# Patient Record
Sex: Female | Born: 1980 | Race: White | Hispanic: No | Marital: Married | Smoking: Former smoker
Health system: Southern US, Community
[De-identification: ages and names within clinical notes are randomized; demographics above are authoritative.]

## PROBLEM LIST (undated history)

## (undated) DIAGNOSIS — F32A Depression, unspecified: Secondary | ICD-10-CM

## (undated) DIAGNOSIS — B009 Herpesviral infection, unspecified: Secondary | ICD-10-CM

## (undated) DIAGNOSIS — E039 Hypothyroidism, unspecified: Secondary | ICD-10-CM

## (undated) DIAGNOSIS — Z62819 Personal history of unspecified abuse in childhood: Secondary | ICD-10-CM

## (undated) DIAGNOSIS — F329 Major depressive disorder, single episode, unspecified: Secondary | ICD-10-CM

## (undated) HISTORY — DX: Depression, unspecified: F32.A

## (undated) HISTORY — DX: Hypothyroidism, unspecified: E03.9

## (undated) HISTORY — PX: OTHER SURGICAL HISTORY: SHX169

## (undated) HISTORY — DX: Herpesviral infection, unspecified: B00.9

## (undated) HISTORY — DX: Personal history of unspecified abuse in childhood: Z62.819

## (undated) HISTORY — DX: Major depressive disorder, single episode, unspecified: F32.9

---

## 2016-04-06 ENCOUNTER — Other Ambulatory Visit: Payer: Self-pay | Admitting: Obstetrics & Gynecology

## 2016-04-06 ENCOUNTER — Other Ambulatory Visit (HOSPITAL_COMMUNITY)
Admission: RE | Admit: 2016-04-06 | Discharge: 2016-04-06 | Disposition: A | Discharge status: Home / Self Care | Payer: 59 | Source: Ambulatory Visit | Attending: Obstetrics & Gynecology | Admitting: Obstetrics & Gynecology

## 2016-04-06 DIAGNOSIS — Z01419 Encounter for gynecological examination (general) (routine) without abnormal findings: Secondary | ICD-10-CM | POA: Diagnosis present

## 2016-04-06 DIAGNOSIS — Z1151 Encounter for screening for human papillomavirus (HPV): Secondary | ICD-10-CM | POA: Diagnosis present

## 2016-04-06 DIAGNOSIS — Z113 Encounter for screening for infections with a predominantly sexual mode of transmission: Secondary | ICD-10-CM | POA: Diagnosis present

## 2016-04-06 LAB — OB RESULTS CONSOLE RUBELLA ANTIBODY, IGM: RUBELLA: IMMUNE

## 2016-04-06 LAB — OB RESULTS CONSOLE GC/CHLAMYDIA
CHLAMYDIA, DNA PROBE: NEGATIVE
GC PROBE AMP, GENITAL: NEGATIVE

## 2016-04-06 LAB — OB RESULTS CONSOLE HIV ANTIBODY (ROUTINE TESTING): HIV: NONREACTIVE

## 2016-04-06 LAB — OB RESULTS CONSOLE ABO/RH: RH Type: POSITIVE

## 2016-04-06 LAB — OB RESULTS CONSOLE RPR: RPR: NONREACTIVE

## 2016-04-06 LAB — OB RESULTS CONSOLE ANTIBODY SCREEN: ANTIBODY SCREEN: NEGATIVE

## 2016-04-06 LAB — OB RESULTS CONSOLE HEPATITIS B SURFACE ANTIGEN: Hepatitis B Surface Ag: NEGATIVE

## 2016-04-07 LAB — CYTOLOGY - PAP

## 2016-09-04 ENCOUNTER — Ambulatory Visit (INDEPENDENT_AMBULATORY_CARE_PROVIDER_SITE_OTHER): Payer: 59 | Admitting: Family Medicine

## 2016-09-04 ENCOUNTER — Encounter: Payer: Self-pay | Admitting: Family Medicine

## 2016-09-04 VITALS — BP 102/78 | HR 92 | Temp 98.2°F | Ht 66.0 in | Wt 162.0 lb

## 2016-09-04 DIAGNOSIS — Z7689 Persons encountering health services in other specified circumstances: Secondary | ICD-10-CM

## 2016-09-04 DIAGNOSIS — E039 Hypothyroidism, unspecified: Secondary | ICD-10-CM | POA: Diagnosis not present

## 2016-09-04 DIAGNOSIS — F329 Major depressive disorder, single episode, unspecified: Secondary | ICD-10-CM | POA: Diagnosis not present

## 2016-09-04 DIAGNOSIS — F32A Depression, unspecified: Secondary | ICD-10-CM

## 2016-09-04 NOTE — Progress Notes (Signed)
Pre visit review using our clinic review tool, if applicable. No additional management support is needed unless otherwise documented below in the visit note. 

## 2016-09-04 NOTE — Progress Notes (Signed)
Patient ID: Lindsey Gay, female   DOB: Feb 27, 1981, 35 y.o.   MRN: [id number]  Patient presents to clinic today to establish care.   She is under the care of her obgyn regarding depression.  She reports that symptoms have been present for 2 to 3 months and she is currently [redacted] weeks pregnant.  She has discussed use of sertraline for symptoms with her obgyn however she is interested in counseling at this time and would prefer to initiate medication after she delivers her baby. She denies suicidal/homicidal ideation or plan.  PHQ indicates mild depression  Depression screen PHQ 2/9 09/04/2016  Decreased Interest 2  Down, Depressed, Hopeless 1  PHQ - 2 Score 3  Altered sleeping 0  Tired, decreased energy 1  Change in appetite 1  Feeling bad or failure about yourself  0  Trouble concentrating 1  Moving slowly or fidgety/restless 0  Suicidal thoughts 0  PHQ-9 Score 6  Difficult doing work/chores Not difficult at all       Followed by endocrinology for hypothyroidism and has a follow up appointment in January.  She denies any changes in hair, skin, nails, heat or cold intolerance, or GI disturbances.   Health Maintenance: Dental -- She is due; recommend twice/yearly Vision --Yearly Immunizations -- UTD; gynecology Colonoscopy -- Not needed Mammogram --Not needed PAP -- UTD; follows gynecology    ROS Constitutional: No fever, chills, significant weight change, fatigue, weakness or night sweats Eyes: No redness, discharge, pain, blurred vision, double vision, or loss of vision ENT/mouth: No nasal congestion, postnasal drainage,epistaxis, purulent discharge, earache, hearing loss, tinnitus ,sore throat , dental pain, or hoarseness   Cardiovascular: no chest pain, palpitations, racing, irregular rhythm, syncope, nausea, sweating, claudication, or edema  Respiratory: No cough, sputum production,hemoptysis,  dyspnea, paroxysmal nocturnal dyspnea, pleuritic chest pain, significant snoring,  or  apnea    Gastrointestinal: No heartburn,dysphagia, nausea and vomiting,ominal pain, change in bowels, anorexia, diarrhea, significant constipation, rectal bleeding, melena,  stool incontinence or jaundice Genitourinary: No dysuria,hematuria, pyuria, frequency, urgency,  incontinence, nocturia, dark urine or flank pain Musculoskeletal: No myalgias or muscle cramping, joint stiffness, joint swelling, joint color change, weakness, or cyanosis Dermatologic: No rash, pruritus, urticaria, or change in color or temperature of skin Neurologic: No headache, vertigo, limb weakness, tremor, gait disturbance, seizures, memory loss, numbness or tingling Psychiatric: No significant anxiety or depression, anhedonia, panic attacks, insomnia, or anorexia Endocrine: No change in hair/skin/ nails, excessive thirst, excessive hunger, excessive urination, or unexplained fatigue Hematologic/lymphatic: No bruising, lymphadenopathy,or  abnormal clotting Allergy/immunology: No itchy/ watery eyes, abnormal sneezing, rhinitis, urticaria ,or angioedema  Past Medical History:  Diagnosis Date  . Hypothyroidism      Social History   Social History  . Marital status: Married    Spouse name: N/A  . Number of children: N/A  . Years of education: N/A   Occupational History  . employee benefits     Social History Main Topics  . Smoking status: Former Games developermoker  . Smokeless tobacco: Former NeurosurgeonUser  . Alcohol use No  . Drug use: No  . Sexual activity: Yes    Birth control/ protection: None   Other Topics Concern  . Not on file   Social History Narrative  . No narrative on file    Past Surgical History:  Procedure Laterality Date  . cyst under tongue      Family History  Problem Relation Age of Onset  . Heart disease Mother   . Alcohol abuse Father   .  Cancer Father   . Drug abuse Father   . Alcohol abuse Maternal Grandmother   . Mental retardation Maternal Grandmother   . Diabetes Maternal Grandmother    . Alcohol abuse Maternal Grandfather     No Known Allergies  No current outpatient prescriptions on file prior to visit.   No current facility-administered medications on file prior to visit.     BP 102/78 (BP Location: Left Arm, Patient Position: Sitting, Cuff Size: Normal)   Pulse 92   Temp 98.2 F (36.8 C) (Oral)   Ht 5\' 6"  (1.676 m)   Wt 162 lb (73.5 kg)   SpO2 97%   BMI 26.15 kg/m     Physical Exam  Constitutional: She is oriented to person, place, and time and well-developed, well-nourished, and in no distress.  [redacted] weeks pregnant  Eyes: Pupils are equal, round, and reactive to light. No scleral icterus.  Neck: Normal range of motion.  Cardiovascular: Normal rate and regular rhythm.   Pulmonary/Chest: Effort normal and breath sounds normal. She has no wheezes. She has no rales.  Musculoskeletal: She exhibits no edema.  Lymphadenopathy:    She has no cervical adenopathy.  Neurological: She is alert and oriented to person, place, and time. Gait normal.  Skin: Skin is warm and dry. No rash noted.     Assessment/Plan:  1. Depression, unspecified depression type PHQ indicates mild depression; no suicidal/homicidal ideation present; she will see her obgyn in 2 days and she will consider use of sertraline that was recommended previously by her obgyn. Today, she is interested in counseling; provided list of providers for [facility] that she will contact for counseling.   2. Hypothyroidism, unspecified type She is followed by endocrinology and will follow up in January for lab work.  3. Encounter to establish care  We reviewed the PMH, PSH, FH, SH, Meds and Allergies. -We provided refills for any medications we will prescribe as needed. -We addressed current concerns per orders and patient instructions. -We have asked for records for pertinent exams, studies, vaccines and notes from previous providers. -We have advised patient to follow up per  instructions below.   Follow up in 6 months for physical or sooner for acute concerns.   Roddie McJulia Reagan Klemz, FNP-C

## 2016-09-04 NOTE — Patient Instructions (Addendum)
It is a pleasure meeting you today. Please follow up with your gynecologist regarding initiation of sertraline as we discussed and consider counseling also which can provide great benefit.    Also, please follow up as needed

## 2016-09-18 NOTE — L&D Delivery Note (Signed)
Delivery Note At 10:56 PM a viable female "as yet unnamed" was delivered precipitously via Vaginal, Spontaneous Delivery (Presentation: LOA). APGARS: 9, 9; weight pending.   Placenta status: Spontaneous, intact. Cord: 3 vessels with the following complications: None. Cord pH: NA  IM Pitocin given as IV infiltrated.  Anesthesia: Local for repair   Episiotomy: None Lacerations: 2nd degree perineal; right inner labial (hemostatic) Suture Repair: 3.0 vicryl CT-1 and SH Est. Blood Loss (mL): 200   Mom to postpartum.  Baby to Couplet care / Skin to Skin. Breastfeeding. Pt to discuss birth control with Dr. Charlotta Newtonzan.  Sherre ScarletWILLIAMS, Cyndra Feinberg 10/18/2016, 11:52 PM

## 2016-09-25 DIAGNOSIS — E063 Autoimmune thyroiditis: Secondary | ICD-10-CM | POA: Diagnosis not present

## 2016-09-25 DIAGNOSIS — E039 Hypothyroidism, unspecified: Secondary | ICD-10-CM | POA: Diagnosis not present

## 2016-10-04 ENCOUNTER — Ambulatory Visit: Payer: 59 | Admitting: Psychology

## 2016-10-11 ENCOUNTER — Ambulatory Visit (INDEPENDENT_AMBULATORY_CARE_PROVIDER_SITE_OTHER): Payer: 59 | Admitting: Psychology

## 2016-10-11 DIAGNOSIS — F331 Major depressive disorder, recurrent, moderate: Secondary | ICD-10-CM

## 2016-10-17 ENCOUNTER — Encounter (HOSPITAL_COMMUNITY): Payer: Self-pay | Admitting: *Deleted

## 2016-10-17 ENCOUNTER — Ambulatory Visit (INDEPENDENT_AMBULATORY_CARE_PROVIDER_SITE_OTHER): Payer: 59 | Admitting: Psychology

## 2016-10-17 ENCOUNTER — Telehealth (HOSPITAL_COMMUNITY): Payer: Self-pay | Admitting: *Deleted

## 2016-10-17 DIAGNOSIS — F331 Major depressive disorder, recurrent, moderate: Secondary | ICD-10-CM

## 2016-10-17 LAB — OB RESULTS CONSOLE GBS: STREP GROUP B AG: NEGATIVE

## 2016-10-17 NOTE — Telephone Encounter (Signed)
Preadmission screen  

## 2016-10-18 ENCOUNTER — Encounter (HOSPITAL_COMMUNITY): Payer: Self-pay | Admitting: Anesthesiology

## 2016-10-18 ENCOUNTER — Inpatient Hospital Stay (HOSPITAL_COMMUNITY)
Admission: AD | Admit: 2016-10-18 | Discharge: 2016-10-20 | DRG: 774 | Disposition: A | Discharge status: Home / Self Care | Payer: 59 | Source: Ambulatory Visit | Attending: Obstetrics & Gynecology | Admitting: Obstetrics & Gynecology

## 2016-10-18 ENCOUNTER — Encounter (HOSPITAL_COMMUNITY): Payer: Self-pay

## 2016-10-18 ENCOUNTER — Ambulatory Visit (INDEPENDENT_AMBULATORY_CARE_PROVIDER_SITE_OTHER): Payer: 59 | Admitting: Psychology

## 2016-10-18 DIAGNOSIS — Z8249 Family history of ischemic heart disease and other diseases of the circulatory system: Secondary | ICD-10-CM

## 2016-10-18 DIAGNOSIS — O99284 Endocrine, nutritional and metabolic diseases complicating childbirth: Secondary | ICD-10-CM | POA: Diagnosis present

## 2016-10-18 DIAGNOSIS — F418 Other specified anxiety disorders: Secondary | ICD-10-CM | POA: Diagnosis present

## 2016-10-18 DIAGNOSIS — Z3A38 38 weeks gestation of pregnancy: Secondary | ICD-10-CM

## 2016-10-18 DIAGNOSIS — Z833 Family history of diabetes mellitus: Secondary | ICD-10-CM

## 2016-10-18 DIAGNOSIS — Z3493 Encounter for supervision of normal pregnancy, unspecified, third trimester: Secondary | ICD-10-CM | POA: Diagnosis not present

## 2016-10-18 DIAGNOSIS — O99344 Other mental disorders complicating childbirth: Secondary | ICD-10-CM | POA: Diagnosis present

## 2016-10-18 DIAGNOSIS — Z8659 Personal history of other mental and behavioral disorders: Secondary | ICD-10-CM

## 2016-10-18 DIAGNOSIS — F331 Major depressive disorder, recurrent, moderate: Secondary | ICD-10-CM

## 2016-10-18 DIAGNOSIS — O9832 Other infections with a predominantly sexual mode of transmission complicating childbirth: Secondary | ICD-10-CM | POA: Diagnosis present

## 2016-10-18 DIAGNOSIS — A6004 Herpesviral vulvovaginitis: Secondary | ICD-10-CM | POA: Diagnosis present

## 2016-10-18 DIAGNOSIS — Z87891 Personal history of nicotine dependence: Secondary | ICD-10-CM

## 2016-10-18 DIAGNOSIS — E039 Hypothyroidism, unspecified: Secondary | ICD-10-CM | POA: Diagnosis present

## 2016-10-18 DIAGNOSIS — O4202 Full-term premature rupture of membranes, onset of labor within 24 hours of rupture: Secondary | ICD-10-CM | POA: Diagnosis present

## 2016-10-18 DIAGNOSIS — O09529 Supervision of elderly multigravida, unspecified trimester: Secondary | ICD-10-CM

## 2016-10-18 DIAGNOSIS — O09521 Supervision of elderly multigravida, first trimester: Secondary | ICD-10-CM

## 2016-10-18 LAB — CBC
HEMATOCRIT: 37.3 % (ref 36.0–46.0)
Hemoglobin: 13.2 g/dL (ref 12.0–15.0)
MCH: 32.4 pg (ref 26.0–34.0)
MCHC: 35.4 g/dL (ref 30.0–36.0)
MCV: 91.4 fL (ref 78.0–100.0)
PLATELETS: 177 10*3/uL (ref 150–400)
RBC: 4.08 MIL/uL (ref 3.87–5.11)
RDW: 13.6 % (ref 11.5–15.5)
WBC: 7.1 10*3/uL (ref 4.0–10.5)

## 2016-10-18 LAB — TYPE AND SCREEN
ABO/RH(D): O POS
Antibody Screen: NEGATIVE

## 2016-10-18 MED ORDER — ONDANSETRON HCL 4 MG PO TABS: 4.0000 mg | ORAL_TABLET | ORAL | Status: DC | PRN

## 2016-10-18 MED ORDER — ZOLPIDEM TARTRATE 5 MG PO TABS: 5.0000 mg | ORAL_TABLET | Freq: Every evening | ORAL | Status: DC | PRN

## 2016-10-18 MED ORDER — ACETAMINOPHEN 325 MG PO TABS: 650.0000 mg | ORAL_TABLET | ORAL | Status: DC | PRN

## 2016-10-18 MED ORDER — OXYCODONE-ACETAMINOPHEN 5-325 MG PO TABS: 1.0000 | ORAL_TABLET | ORAL | Status: DC | PRN

## 2016-10-18 MED ORDER — OXYTOCIN 10 UNIT/ML IJ SOLN
INTRAMUSCULAR | Status: AC
Start: 2016-10-18 — End: 2016-10-18
  Administered 2016-10-18: 10 [IU]
  Filled 2016-10-18: qty 1

## 2016-10-18 MED ORDER — ACETAMINOPHEN 325 MG PO TABS
650.0000 mg | ORAL_TABLET | ORAL | Status: DC | PRN
  Administered 2016-10-19: 650 mg via ORAL
  Filled 2016-10-18: qty 2

## 2016-10-18 MED ORDER — WITCH HAZEL-GLYCERIN EX PADS: 1.0000 "application " | MEDICATED_PAD | CUTANEOUS | Status: DC | PRN

## 2016-10-18 MED ORDER — LACTATED RINGERS IV SOLN
INTRAVENOUS | Status: DC
  Administered 2016-10-18: 22:00:00 via INTRAVENOUS

## 2016-10-18 MED ORDER — PHENYLEPHRINE 40 MCG/ML (10ML) SYRINGE FOR IV PUSH (FOR BLOOD PRESSURE SUPPORT)
80.0000 ug | PREFILLED_SYRINGE | INTRAVENOUS | Status: DC | PRN
  Filled 2016-10-18: qty 5

## 2016-10-18 MED ORDER — FENTANYL CITRATE (PF) 100 MCG/2ML IJ SOLN
50.0000 ug | INTRAMUSCULAR | Status: DC | PRN
  Filled 2016-10-18: qty 2

## 2016-10-18 MED ORDER — OXYTOCIN BOLUS FROM INFUSION: 500.0000 mL | Freq: Once | INTRAVENOUS | Status: DC

## 2016-10-18 MED ORDER — PHENYLEPHRINE 40 MCG/ML (10ML) SYRINGE FOR IV PUSH (FOR BLOOD PRESSURE SUPPORT)
PREFILLED_SYRINGE | INTRAVENOUS | Status: AC
  Filled 2016-10-18: qty 20

## 2016-10-18 MED ORDER — SOD CITRATE-CITRIC ACID 500-334 MG/5ML PO SOLN: 30.0000 mL | ORAL | Status: DC | PRN

## 2016-10-18 MED ORDER — COCONUT OIL OIL: 1.0000 "application " | TOPICAL_OIL | Status: DC | PRN

## 2016-10-18 MED ORDER — OXYTOCIN 40 UNITS IN LACTATED RINGERS INFUSION - SIMPLE MED
INTRAVENOUS | Status: AC
  Filled 2016-10-18: qty 1000

## 2016-10-18 MED ORDER — OXYCODONE-ACETAMINOPHEN 5-325 MG PO TABS
2.0000 | ORAL_TABLET | ORAL | Status: DC | PRN
Start: 2016-10-18 — End: 2016-10-20

## 2016-10-18 MED ORDER — LACTATED RINGERS IV SOLN: 500.0000 mL | INTRAVENOUS | Status: DC | PRN

## 2016-10-18 MED ORDER — FENTANYL 2.5 MCG/ML BUPIVACAINE 1/10 % EPIDURAL INFUSION (WH - ANES)
14.0000 mL/h | INTRAMUSCULAR | Status: DC | PRN
Start: 2016-10-18 — End: 2016-10-19

## 2016-10-18 MED ORDER — DIPHENHYDRAMINE HCL 50 MG/ML IJ SOLN: 12.5000 mg | INTRAMUSCULAR | Status: DC | PRN

## 2016-10-18 MED ORDER — SIMETHICONE 80 MG PO CHEW: 80.0000 mg | CHEWABLE_TABLET | ORAL | Status: DC | PRN

## 2016-10-18 MED ORDER — TETANUS-DIPHTH-ACELL PERTUSSIS 5-2.5-18.5 LF-MCG/0.5 IM SUSP
0.5000 mL | Freq: Once | INTRAMUSCULAR | Status: DC
  Filled 2016-10-18: qty 0.5

## 2016-10-18 MED ORDER — LACTATED RINGERS IV SOLN
500.0000 mL | Freq: Once | INTRAVENOUS | Status: AC
  Administered 2016-10-18: 500 mL via INTRAVENOUS

## 2016-10-18 MED ORDER — ONDANSETRON HCL 4 MG/2ML IJ SOLN: 4.0000 mg | Freq: Four times a day (QID) | INTRAMUSCULAR | Status: DC | PRN

## 2016-10-18 MED ORDER — DIPHENHYDRAMINE HCL 25 MG PO CAPS: 25.0000 mg | ORAL_CAPSULE | Freq: Four times a day (QID) | ORAL | Status: DC | PRN

## 2016-10-18 MED ORDER — OXYCODONE-ACETAMINOPHEN 5-325 MG PO TABS: 2.0000 | ORAL_TABLET | ORAL | Status: DC | PRN

## 2016-10-18 MED ORDER — EPHEDRINE 5 MG/ML INJ
10.0000 mg | INTRAVENOUS | Status: DC | PRN
  Filled 2016-10-18: qty 4

## 2016-10-18 MED ORDER — IBUPROFEN 600 MG PO TABS
600.0000 mg | ORAL_TABLET | Freq: Four times a day (QID) | ORAL | Status: DC
  Administered 2016-10-19 – 2016-10-20 (×5): 600 mg via ORAL
  Filled 2016-10-18 (×5): qty 1

## 2016-10-18 MED ORDER — FENTANYL 2.5 MCG/ML BUPIVACAINE 1/10 % EPIDURAL INFUSION (WH - ANES)
INTRAMUSCULAR | Status: AC
  Filled 2016-10-18: qty 100

## 2016-10-18 MED ORDER — FLEET ENEMA 7-19 GM/118ML RE ENEM: 1.0000 | ENEMA | RECTAL | Status: DC | PRN

## 2016-10-18 MED ORDER — DIBUCAINE 1 % RE OINT: 1.0000 "application " | TOPICAL_OINTMENT | RECTAL | Status: DC | PRN

## 2016-10-18 MED ORDER — LIDOCAINE HCL (PF) 1 % IJ SOLN
30.0000 mL | INTRAMUSCULAR | Status: DC | PRN
  Filled 2016-10-18: qty 30

## 2016-10-18 MED ORDER — LIDOCAINE HCL (PF) 1 % IJ SOLN
INTRAMUSCULAR | Status: AC
  Filled 2016-10-18: qty 30

## 2016-10-18 MED ORDER — SENNOSIDES-DOCUSATE SODIUM 8.6-50 MG PO TABS
2.0000 | ORAL_TABLET | ORAL | Status: DC
  Administered 2016-10-19: 2 via ORAL
  Filled 2016-10-18: qty 2

## 2016-10-18 MED ORDER — PRENATAL MULTIVITAMIN CH
1.0000 | ORAL_TABLET | Freq: Every day | ORAL | Status: DC
  Administered 2016-10-19: 1 via ORAL
  Filled 2016-10-18: qty 1

## 2016-10-18 MED ORDER — EPHEDRINE 5 MG/ML INJ
INTRAVENOUS | Status: AC
  Filled 2016-10-18: qty 4

## 2016-10-18 MED ORDER — BENZOCAINE-MENTHOL 20-0.5 % EX AERO
1.0000 "application " | INHALATION_SPRAY | CUTANEOUS | Status: DC | PRN
  Filled 2016-10-18: qty 56

## 2016-10-18 MED ORDER — ONDANSETRON HCL 4 MG/2ML IJ SOLN: 4.0000 mg | INTRAMUSCULAR | Status: DC | PRN

## 2016-10-18 MED ORDER — OXYTOCIN 40 UNITS IN LACTATED RINGERS INFUSION - SIMPLE MED: 2.5000 [IU]/h | INTRAVENOUS | Status: DC

## 2016-10-18 NOTE — Anesthesia Preprocedure Evaluation (Deleted)
Anesthesia Evaluation  Patient identified by MRN, date of birth, ID band Patient awake    Reviewed: Allergy & Precautions, Patient's Chart, lab work & pertinent test results  Airway Mallampati: II       Dental  (+) Teeth Intact   Pulmonary former smoker,    breath sounds clear to auscultation       Cardiovascular negative cardio ROS   Rhythm:Regular Rate:Normal     Neuro/Psych PSYCHIATRIC DISORDERS Depression negative neurological ROS     GI/Hepatic negative GI ROS, Neg liver ROS,   Endo/Other  Hypothyroidism   Renal/GU negative Renal ROS  negative genitourinary   Musculoskeletal negative musculoskeletal ROS (+)   Abdominal   Peds negative pediatric ROS (+)  Hematology negative hematology ROS (+)   Anesthesia Other Findings   Reproductive/Obstetrics negative OB ROS                             Lab Results  Component Value Date   WBC 7.1 10/18/2016   HGB 13.2 10/18/2016   HCT 37.3 10/18/2016   MCV 91.4 10/18/2016   PLT 177 10/18/2016     Anesthesia Physical Anesthesia Plan  ASA: II  Anesthesia Plan: Epidural   Post-op Pain Management:    Induction:   Airway Management Planned:   Additional Equipment:   Intra-op Plan:   Post-operative Plan:   Informed Consent: I have reviewed the patients History and Physical, chart, labs and discussed the procedure including the risks, benefits and alternatives for the proposed anesthesia with the patient or authorized representative who has indicated his/her understanding and acceptance.     Plan Discussed with:   Anesthesia Plan Comments:         Anesthesia Quick Evaluation

## 2016-10-18 NOTE — MAU Note (Signed)
Pt presents with complaint of labor 
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
## 2016-10-19 ENCOUNTER — Encounter (HOSPITAL_COMMUNITY): Payer: Self-pay

## 2016-10-19 DIAGNOSIS — O09529 Supervision of elderly multigravida, unspecified trimester: Secondary | ICD-10-CM

## 2016-10-19 DIAGNOSIS — Z8659 Personal history of other mental and behavioral disorders: Secondary | ICD-10-CM

## 2016-10-19 LAB — CBC
HEMATOCRIT: 33 % — AB (ref 36.0–46.0)
HEMOGLOBIN: 11.6 g/dL — AB (ref 12.0–15.0)
MCH: 32.2 pg (ref 26.0–34.0)
MCHC: 35.2 g/dL (ref 30.0–36.0)
MCV: 91.7 fL (ref 78.0–100.0)
Platelets: 171 10*3/uL (ref 150–400)
RBC: 3.6 MIL/uL — ABNORMAL LOW (ref 3.87–5.11)
RDW: 13.7 % (ref 11.5–15.5)
WBC: 10 10*3/uL (ref 4.0–10.5)

## 2016-10-19 LAB — ABO/RH: ABO/RH(D): O POS

## 2016-10-19 LAB — RPR: RPR: NONREACTIVE

## 2016-10-19 MED ORDER — LEVOTHYROXINE SODIUM 75 MCG PO TABS
75.0000 ug | ORAL_TABLET | Freq: Every day | ORAL | Status: DC
  Administered 2016-10-20: 75 ug via ORAL
  Filled 2016-10-19 (×2): qty 1

## 2016-10-19 MED ORDER — LEVOTHYROXINE SODIUM 50 MCG PO TABS
50.0000 ug | ORAL_TABLET | Freq: Every day | ORAL | Status: DC
  Administered 2016-10-19: 50 ug via ORAL
  Filled 2016-10-19: qty 1

## 2016-10-19 MED ORDER — SERTRALINE HCL 50 MG PO TABS
50.0000 mg | ORAL_TABLET | Freq: Every day | ORAL | Status: DC
  Filled 2016-10-19 (×2): qty 1

## 2016-10-19 MED ORDER — SERTRALINE HCL 50 MG PO TABS: 50.0000 mg | ORAL_TABLET | Freq: Every day | ORAL | Status: DC

## 2016-10-19 MED ORDER — IBUPROFEN 600 MG PO TABS
600.0000 mg | ORAL_TABLET | Freq: Four times a day (QID) | ORAL | 0 refills | Status: AC
Start: 2016-10-20 — End: ?

## 2016-10-19 NOTE — Progress Notes (Signed)
Postpartum Note Day # 1  S:  Patient resting comfortable in bed.  Pain controlled.  Tolerating general diet. No flatus, no BM.  Lochia moderate.  Ambulating without difficulty.  She denies n/v/f/c, SOB, or CP.  Pt plans on breastfeeding.  O: Temp:  [97.7 F (36.5 C)-98.5 F (36.9 C)] 97.7 F (36.5 C) (02/01 0614) Pulse Rate:  [65-117] 65 (02/01 0614) Resp:  [16-18] 16 (02/01 0614) BP: (97-121)/(51-74) 97/51 (02/01 0614) SpO2:  [98 %] 98 % (02/01 0614) Weight:  [172 lb (78 kg)] 172 lb (78 kg) (01/31 2212)   Gen: A&Ox3 CV: RRR, no MRG Resp: CTAB Abdomen: soft, NT, ND Uterus: firm, non-tender, below umbilicus Ext: No edema, no calf tenderness bilaterally  Labs:  CBC Latest Ref Rng & Units 10/19/2016 10/18/2016  WBC 4.0 - 10.5 K/uL 10.0 7.1  Hemoglobin 12.0 - 15.0 g/dL 11.6(L) 13.2  Hematocrit 36.0 - 46.0 % 33.0(L) 37.3  Platelets 150 - 400 K/uL 171 177    A/P: Pt is a 36 y.o. G2P2002 s/p NSVD, PPD#1  - Pain well controlled -GU: Voiding freely -GI: Tolerating general diet -Activity: encouraged sitting up to chair and ambulation as tolerated -Prophylaxis: early ambulation -Labs: stable as above -Anxiety/Depression: continue Zoloft 50mg  daily -Levothyroxine: continue 75mcg daily  DISPO: Continue with routine postpartum care, plan for discharge home on PPD#2.  Myna HidalgoJennifer Yochanan Eddleman, DO [phone] (pager) [phone] (office)

## 2016-10-19 NOTE — Discharge Instructions (Signed)

## 2016-10-19 NOTE — Lactation Note (Signed)
This note was copied from a baby's chart. Lactation Consultation Note  Baby 12 hours old.  P2.  Ex BF 18 months. Mother states she has recently attempted bf but baby is sleepy. Reviewed waking techniques and encouraged STS. Mother denies questions or concerns. Discussed cluster feeding, latch depth.  Mom encouraged to feed baby 8-12 times/24 hours and with feeding cues.  Mom made aware of O/P services, breastfeeding support groups, community resources, and our phone # for post-discharge questions.    Patient Name: [name]'s Date: 10/19/2016 Reason for consult: Initial assessment   Maternal Data Has patient been taught Hand Expression?: Yes Does the patient have breastfeeding experience prior to this delivery?: Yes  Feeding Feeding Type: Breast Fed  LATCH Score/Interventions                      Lactation Tools Discussed/Used     Consult Status Consult Status: Follow-up Date: 10/20/16 Follow-up type: In-patient    Dahlia ByesBerkelhammer, Wil [facility]ape [facility] 10/19/2016, 11:43 AM
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
## 2016-10-20 NOTE — Progress Notes (Signed)
Postpartum Note Day # 2  S:  Patient resting comfortable in bed.  Pain controlled.  Tolerating general diet. + flatus, no BM.  Lochia moderate.  Ambulating without difficulty.  She denies n/v/f/c, SOB, or CP.  Pt plans on breastfeeding.  O: Temp:  [97.6 F (36.4 C)-98.1 F (36.7 C)] 98.1 F (36.7 C) (02/02 0602) Pulse Rate:  [71-78] 78 (02/02 0602) Resp:  [18] 18 (02/02 0602) BP: (106-110)/(64-67) 110/67 (02/02 0602)   Gen: A&Ox3 CV: RRR, no MRG Resp: CTAB Abdomen: soft, NT, ND Uterus: firm, non-tender, below umbilicus Ext: No edema, no calf tenderness bilaterally  Labs:  CBC Latest Ref Rng & Units 10/19/2016 10/18/2016  WBC 4.0 - 10.5 K/uL 10.0 7.1  Hemoglobin 12.0 - 15.0 g/dL 11.6(L) 13.2  Hematocrit 36.0 - 46.0 % 33.0(L) 37.3  Platelets 150 - 400 K/uL 171 177    A/P: Pt is a 36 y.o. G2P2002 s/p NSVD, PPD#2  - Pain well controlled -GU: Voiding freely -GI: Tolerating general diet -Activity: encouraged sitting up to chair and ambulation as tolerated -Prophylaxis: early ambulation -Labs: stable as above -Anxiety/Depression: continue Zoloft 50mg  daily -Levothyroxine: continue 75mcg daily  DISPO: Meeting postpartum milestones appropriately, plan for discharge home today.  Myna HidalgoJennifer Braylen Denunzio, DO [phone] (pager) [phone] (office)

## 2016-10-25 NOTE — Discharge Summary (Signed)
OB Discharge Summary     Patient Name: Lindsey Gay DOB: 12-Dec-1980 MRN: [id number]  Date of admission: 10/18/2016 Delivering MD: Sherre Scarlet   Date of discharge: 10/20/2016  Admitting diagnosis: 38WKS CTX Intrauterine pregnancy: [redacted]w[redacted]d     Secondary diagnosis:  Principal Problem:   Vaginal delivery Active Problems:   Second-degree perineal laceration, with delivery   AMA (advanced maternal age) multigravida 35+   Herpes simplex vulvovaginitis   History of depression  Additional problems: none     Discharge diagnosis: Term Pregnancy Delivered                                                                                                Post partum procedures:none  Augmentation: none  Complications: None  Hospital course:  Onset of Labor With Vaginal Delivery     36 y.o. yo G2P1002 at [redacted]w[redacted]d was admitted in Active Labor on 10/18/2016. Patient had an uncomplicated labor course as follows:  Membrane Rupture Time/Date: 10:45 PM ,10/18/2016   Intrapartum Procedures: Episiotomy: None [1]                                         Lacerations:  2nd degree [3]  Patient had a delivery of a Viable infant. 10/18/2016  Information for the patient's newborn:  Peyton, Rossner [[id number]]  Delivery Method: Vag-Spont    Pateint had an uncomplicated postpartum course.  She is ambulating, tolerating a regular diet, passing flatus, and urinating well. Patient is discharged home in stable condition on 10/20/16.   Physical exam  Vitals:   10/19/16 0226 10/19/16 0614 10/19/16 1349 10/20/16 0602  BP: 112/71 (!) 97/51 106/64 110/67  Pulse: 87 65 71 78  Resp: 16 16 18 18   Temp: 97.8 F (36.6 C) 97.7 F (36.5 C) 97.6 F (36.4 C) 98.1 F (36.7 C)  TempSrc: Oral Oral Oral Oral  SpO2: 98% 98%    Weight:      Height:       General: alert, cooperative and no distress Lochia: appropriate Uterine Fundus: firm Incision: N/A DVT Evaluation: No evidence of DVT seen on physical  exam. Labs: Lab Results  Component Value Date   WBC 10.0 10/19/2016   HGB 11.6 (L) 10/19/2016   HCT 33.0 (L) 10/19/2016   MCV 91.7 10/19/2016   PLT 171 10/19/2016   No flowsheet data found.  Discharge instruction: per After Visit Summary and "Baby and Me Booklet".  After visit meds:  Allergies as of 10/20/2016   No Known Allergies     Medication List    TAKE these medications   hydrocortisone cream 1 % Apply 1 application topically 2 (two) times daily.   ibuprofen 600 MG tablet Commonly known as:  ADVIL,MOTRIN Take 1 tablet (600 mg total) by mouth every 6 (six) hours.   levothyroxine 50 MCG tablet Commonly known as:  SYNTHROID, LEVOTHROID Take 50 mcg by mouth daily before breakfast.   prenatal multivitamin Tabs tablet Take 1 tablet by mouth daily at 12 noon.  sertraline 50 MG tablet Commonly known as:  ZOLOFT Take 1 tablet (50 mg total) by mouth daily.   VITAMIN D PO Take 1 tablet by mouth daily.       Diet: routine diet  Activity: Advance as tolerated. Pelvic rest for 6 weeks.   Outpatient follow up:6 weeks Follow up Appt:No future appointments. Follow up Visit:No Follow-up on file.  Postpartum contraception: Undecided  Newborn Data: Live born female  Birth Weight: 6 lb 8.8 oz (2970 g) APGAR: 9, 9  Baby Feeding: Breast Disposition:home with mother   10/25/2016 Myna HidalgoZAN, Deshia Vanderhoof, M, DO

## 2016-10-26 ENCOUNTER — Inpatient Hospital Stay (HOSPITAL_COMMUNITY): Admission: RE | Admit: 2016-10-26 | Payer: 59 | Source: Ambulatory Visit

## 2016-11-27 DIAGNOSIS — E063 Autoimmune thyroiditis: Secondary | ICD-10-CM | POA: Diagnosis not present

## 2016-11-27 DIAGNOSIS — E039 Hypothyroidism, unspecified: Secondary | ICD-10-CM | POA: Diagnosis not present

## 2017-05-08 DIAGNOSIS — L03011 Cellulitis of right finger: Secondary | ICD-10-CM | POA: Diagnosis not present

## 2017-05-09 ENCOUNTER — Encounter (HOSPITAL_COMMUNITY): Payer: Self-pay

## 2017-05-09 ENCOUNTER — Emergency Department (HOSPITAL_COMMUNITY)
Admission: EM | Admit: 2017-05-09 | Discharge: 2017-05-09 | Disposition: A | Discharge status: Home / Self Care | Payer: 59 | Attending: Emergency Medicine | Admitting: Emergency Medicine

## 2017-05-09 DIAGNOSIS — Y658 Other specified misadventures during surgical and medical care: Secondary | ICD-10-CM | POA: Insufficient documentation

## 2017-05-09 DIAGNOSIS — T887XXA Unspecified adverse effect of drug or medicament, initial encounter: Secondary | ICD-10-CM | POA: Diagnosis not present

## 2017-05-09 DIAGNOSIS — Z79899 Other long term (current) drug therapy: Secondary | ICD-10-CM | POA: Diagnosis not present

## 2017-05-09 DIAGNOSIS — T3695XA Adverse effect of unspecified systemic antibiotic, initial encounter: Secondary | ICD-10-CM | POA: Diagnosis not present

## 2017-05-09 DIAGNOSIS — L03011 Cellulitis of right finger: Secondary | ICD-10-CM | POA: Diagnosis not present

## 2017-05-09 DIAGNOSIS — T50905A Adverse effect of unspecified drugs, medicaments and biological substances, initial encounter: Secondary | ICD-10-CM

## 2017-05-09 DIAGNOSIS — E039 Hypothyroidism, unspecified: Secondary | ICD-10-CM | POA: Diagnosis not present

## 2017-05-09 MED ORDER — SULFAMETHOXAZOLE-TRIMETHOPRIM 800-160 MG PO TABS: 1.0000 | ORAL_TABLET | Freq: Two times a day (BID) | ORAL | 0 refills | Status: AC
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
## 2017-05-09 NOTE — ED Triage Notes (Signed)
Pt reports taking keflex for finger infection and having reactions about 1 hour after each dose. Pt reports she starts to feel flushed, headache, nausea, rash that comes and goes, chills. NAD VSS. Pt has taken 3 doses at this time.

## 2017-05-22 DIAGNOSIS — T7840XA Allergy, unspecified, initial encounter: Secondary | ICD-10-CM | POA: Diagnosis not present

## 2017-05-22 DIAGNOSIS — R21 Rash and other nonspecific skin eruption: Secondary | ICD-10-CM | POA: Diagnosis not present

## 2017-06-11 DIAGNOSIS — E038 Other specified hypothyroidism: Secondary | ICD-10-CM | POA: Diagnosis not present

## 2017-06-13 DIAGNOSIS — E039 Hypothyroidism, unspecified: Secondary | ICD-10-CM | POA: Diagnosis not present

## 2017-06-13 DIAGNOSIS — E063 Autoimmune thyroiditis: Secondary | ICD-10-CM | POA: Diagnosis not present

## 2017-07-10 DIAGNOSIS — Z23 Encounter for immunization: Secondary | ICD-10-CM | POA: Diagnosis not present

## 2018-06-14 DIAGNOSIS — E039 Hypothyroidism, unspecified: Secondary | ICD-10-CM | POA: Diagnosis not present

## 2018-06-18 DIAGNOSIS — E039 Hypothyroidism, unspecified: Secondary | ICD-10-CM | POA: Diagnosis not present

## 2018-06-18 DIAGNOSIS — E063 Autoimmune thyroiditis: Secondary | ICD-10-CM | POA: Diagnosis not present

## 2018-07-09 DIAGNOSIS — Z13220 Encounter for screening for lipoid disorders: Secondary | ICD-10-CM | POA: Diagnosis not present

## 2018-07-09 DIAGNOSIS — Z0001 Encounter for general adult medical examination with abnormal findings: Secondary | ICD-10-CM | POA: Diagnosis not present

## 2018-07-29 ENCOUNTER — Other Ambulatory Visit: Payer: Self-pay | Admitting: Internal Medicine

## 2018-07-29 ENCOUNTER — Ambulatory Visit
Admission: RE | Admit: 2018-07-29 | Discharge: 2018-07-29 | Disposition: A | Discharge status: Home / Self Care | Payer: 59 | Source: Ambulatory Visit | Attending: Internal Medicine | Admitting: Internal Medicine

## 2018-07-29 DIAGNOSIS — M25572 Pain in left ankle and joints of left foot: Secondary | ICD-10-CM | POA: Diagnosis not present

## 2018-07-29 DIAGNOSIS — M25562 Pain in left knee: Secondary | ICD-10-CM

## 2018-07-29 DIAGNOSIS — M7989 Other specified soft tissue disorders: Secondary | ICD-10-CM | POA: Diagnosis not present

## 2018-07-30 DIAGNOSIS — E039 Hypothyroidism, unspecified: Secondary | ICD-10-CM | POA: Diagnosis not present

## 2018-08-08 DIAGNOSIS — J111 Influenza due to unidentified influenza virus with other respiratory manifestations: Secondary | ICD-10-CM | POA: Diagnosis not present

## 2018-08-08 DIAGNOSIS — Z20828 Contact with and (suspected) exposure to other viral communicable diseases: Secondary | ICD-10-CM | POA: Diagnosis not present

## 2019-02-04 ENCOUNTER — Encounter (HOSPITAL_COMMUNITY): Payer: Self-pay

## 2019-02-04 ENCOUNTER — Other Ambulatory Visit: Payer: Self-pay

## 2019-02-04 ENCOUNTER — Emergency Department (HOSPITAL_COMMUNITY): Payer: 59

## 2019-02-04 ENCOUNTER — Emergency Department (HOSPITAL_COMMUNITY)
Admission: EM | Admit: 2019-02-04 | Discharge: 2019-02-04 | Disposition: A | Discharge status: Home / Self Care | Payer: 59 | Attending: Emergency Medicine | Admitting: Emergency Medicine

## 2019-02-04 DIAGNOSIS — R002 Palpitations: Secondary | ICD-10-CM

## 2019-02-04 DIAGNOSIS — E039 Hypothyroidism, unspecified: Secondary | ICD-10-CM | POA: Diagnosis not present

## 2019-02-04 DIAGNOSIS — Z87891 Personal history of nicotine dependence: Secondary | ICD-10-CM | POA: Diagnosis not present

## 2019-02-04 DIAGNOSIS — Z79899 Other long term (current) drug therapy: Secondary | ICD-10-CM | POA: Diagnosis not present

## 2019-02-04 DIAGNOSIS — R079 Chest pain, unspecified: Secondary | ICD-10-CM | POA: Insufficient documentation

## 2019-02-04 LAB — CBC WITH DIFFERENTIAL/PLATELET
Abs Immature Granulocytes: 0.01 10*3/uL (ref 0.00–0.07)
Basophils Absolute: 0 10*3/uL (ref 0.0–0.1)
Basophils Relative: 1 %
Eosinophils Absolute: 0 10*3/uL (ref 0.0–0.5)
Eosinophils Relative: 1 %
HCT: 45.2 % (ref 36.0–46.0)
Hemoglobin: 15.1 g/dL — ABNORMAL HIGH (ref 12.0–15.0)
Immature Granulocytes: 0 %
Lymphocytes Relative: 29 %
Lymphs Abs: 1.1 10*3/uL (ref 0.7–4.0)
MCH: 30.9 pg (ref 26.0–34.0)
MCHC: 33.4 g/dL (ref 30.0–36.0)
MCV: 92.6 fL (ref 80.0–100.0)
Monocytes Absolute: 0.5 10*3/uL (ref 0.1–1.0)
Monocytes Relative: 12 %
Neutro Abs: 2.2 10*3/uL (ref 1.7–7.7)
Neutrophils Relative %: 57 %
Platelets: 196 10*3/uL (ref 150–400)
RBC: 4.88 MIL/uL (ref 3.87–5.11)
RDW: 12.4 % (ref 11.5–15.5)
WBC: 3.9 10*3/uL — ABNORMAL LOW (ref 4.0–10.5)
nRBC: 0 % (ref 0.0–0.2)

## 2019-02-04 LAB — BASIC METABOLIC PANEL
Anion gap: 11 (ref 5–15)
BUN: 6 mg/dL (ref 6–20)
CO2: 22 mmol/L (ref 22–32)
Calcium: 9.7 mg/dL (ref 8.9–10.3)
Chloride: 106 mmol/L (ref 98–111)
Creatinine, Ser: 0.87 mg/dL (ref 0.44–1.00)
GFR calc Af Amer: >60 mL/min (ref >60)
GFR calc non Af Amer: >60 mL/min (ref >60)
Glucose, Bld: 111 mg/dL — ABNORMAL HIGH (ref 70–99)
Potassium: 3.6 mmol/L (ref 3.5–5.1)
Sodium: 139 mmol/L (ref 135–145)

## 2019-02-04 LAB — TSH: TSH: 3.689 u[IU]/mL (ref 0.350–4.500)

## 2019-02-04 LAB — TROPONIN I: Troponin I: <0.03 ng/mL (ref <0.03)

## 2019-02-04 LAB — MAGNESIUM: Magnesium: 2 mg/dL (ref 1.7–2.4)

## 2019-02-04 LAB — D-DIMER, QUANTITATIVE (NOT AT ARMC): D-Dimer, Quant: <0.27 ug/mL-FEU (ref 0.00–0.50)

## 2019-02-04 MED ORDER — ONDANSETRON 4 MG PO TBDP
4.0000 mg | ORAL_TABLET | Freq: Once | ORAL | Status: AC
  Administered 2019-02-04: 4 mg via ORAL
  Filled 2019-02-04: qty 1

## 2019-02-04 NOTE — Discharge Instructions (Signed)
It was my pleasure taking care of you today!   Fortunately, your blood work today was normal.  I still would like you to call the cardiology clinic today or first thing in the morning to schedule a follow up appointment. You will likely need something called a Holter monitor which watches your heart for much longer than we can watch you in the ER.   Please return to the ER for new or worsening symptoms, any additional concerns.

## 2019-02-04 NOTE — ED Triage Notes (Signed)
Pt with c/o L sided CP with radiation to LA after exercising today; pt states that she felt a little nauseous. Pt states that she felt like her heart has been racing (having "palpations") it has happened over last couple of weeks.
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
## 2019-02-05 ENCOUNTER — Telehealth: Payer: Self-pay

## 2019-02-05 ENCOUNTER — Telehealth (INDEPENDENT_AMBULATORY_CARE_PROVIDER_SITE_OTHER): Payer: 59 | Admitting: Cardiology

## 2019-02-05 ENCOUNTER — Encounter: Payer: Self-pay | Admitting: Cardiology

## 2019-02-05 VITALS — Ht 67.0 in | Wt 135.0 lb

## 2019-02-05 DIAGNOSIS — R002 Palpitations: Secondary | ICD-10-CM

## 2019-02-05 NOTE — Telephone Encounter (Signed)
YOUR CARDIOLOGY TEAM HAS ARRANGED FOR AN E-VISIT FOR YOUR APPOINTMENT - PLEASE REVIEW IMPORTANT INFORMATION BELOW SEVERAL DAYS PRIOR TO YOUR APPOINTMENT  Due to the recent COVID-19 pandemic, we are transitioning in-person office visits to tele-medicine visits in an effort to decrease unnecessary exposure to our patients, their families, and staff. These visits are billed to your insurance just like a normal visit is. We also encourage you to sign up for MyChart if you have not already done so. You will need a smartphone if possible. For patients that do not have this, we can still complete the visit using a regular telephone but do prefer a smartphone to enable video when possible. You may have a family member that lives with you that can help. If possible, we also ask that you have a blood pressure cuff and scale at home to measure your blood pressure, heart rate and weight prior to your scheduled appointment. Patients with clinical needs that need an in-person evaluation and testing will still be able to come to the office if absolutely necessary. If you have any questions, feel free to call our office.     YOUR PROVIDER WILL BE USING THE FOLLOWING PLATFORM TO COMPLETE YOUR VISIT: [email or web address]  . IF USING MYCHART - How to Download the MyChart App to Your SmartPhone   - If Apple, go to App Store and type in MyChart in the search bar and download the app. If Android, ask patient to go to Google Play Store and type in MyChart in the search bar and download the app. The app is free but as with any other app downloads, your phone may require you to verify saved payment information or Apple/Android password.  - You will need to then log into the app with your MyChart username and password, and select [facility] as your healthcare provider to link the account.  - When it is time for your visit, go to the MyChart app, find appointments, and click Begin Video Visit. Be sure to Select Allow for your device to  access the Microphone and Camera for your visit. You will then be connected, and your provider will be with you shortly.  **If you have any issues connecting or need assistance, please contact MyChart service desk [phone] ([phone])**  **If using a computer, in order to ensure the best quality for your visit, you will need to use either of the following Internet Browsers: Google Chrome or Microsoft Edge**  . IF USING DOXIMITY or [email or web address] - The staff will give you instructions on receiving your link to join the meeting the day of your visit.      2-3 DAYS BEFORE YOUR APPOINTMENT  You will receive a telephone call from one of our [facility] team members - your caller ID may say "Unknown caller." If this is a video visit, we will walk you through how to get the video launched on your phone. We will remind you check your blood pressure, heart rate and weight prior to your scheduled appointment. If you have an Apple Watch or Kardia, please upload any pertinent ECG strips the day before or morning of your appointment to MyChart. Our staff will also make sure you have reviewed the consent and agree to move forward with your scheduled tele-health visit.     THE DAY OF YOUR APPOINTMENT  Approximately 15 minutes prior to your scheduled appointment, you will receive a telephone call from one of [facility] team - your caller ID may say "Unknown caller."    Our staff will confirm medications, vital signs for the day and any symptoms you may be experiencing. Please have this information available prior to the time of visit start. It may also be helpful for you to have a pad of paper and pen handy for any instructions given during your visit. They will also walk you through joining the smartphone meeting if this is a video visit.    CONSENT FOR TELE-HEALTH VISIT - PLEASE REVIEW  I hereby voluntarily request, consent and authorize [facility] and its employed or contracted physicians, physician  assistants, nurse practitioners or other licensed health care professionals (the Practitioner), to provide me with telemedicine health care services (the "Services") as deemed necessary by the treating Practitioner. I acknowledge and consent to receive the Services by the Practitioner via telemedicine. I understand that the telemedicine visit will involve communicating with the Practitioner through live audiovisual communication technology and the disclosure of certain medical information by electronic transmission. I acknowledge that I have been given the opportunity to request an in-person assessment or other available alternative prior to the telemedicine visit and am voluntarily participating in the telemedicine visit.  I understand that I have the right to withhold or withdraw my consent to the use of telemedicine in the course of my care at any time, without affecting my right to future care or treatment, and that the Practitioner or I may terminate the telemedicine visit at any time. I understand that I have the right to inspect all information obtained and/or recorded in the course of the telemedicine visit and may receive copies of available information for a reasonable fee.  I understand that some of the potential risks of receiving the Services via telemedicine include:  . Delay or interruption in medical evaluation due to technological equipment failure or disruption; . Information transmitted may not be sufficient (e.g. poor resolution of images) to allow for appropriate medical decision making by the Practitioner; and/or  . In rare instances, security protocols could fail, causing a breach of personal health information.  Furthermore, I acknowledge that it is my responsibility to provide information about my medical history, conditions and care that is complete and accurate to the best of my ability. I acknowledge that Practitioner's advice, recommendations, and/or decision may be based on  factors not within their control, such as incomplete or inaccurate data provided by me or distortions of diagnostic images or specimens that may result from electronic transmissions. I understand that the practice of medicine is not an exact science and that Practitioner makes no warranties or guarantees regarding treatment outcomes. I acknowledge that I will receive a copy of this consent concurrently upon execution via email to the email address I last provided but may also request a printed copy by calling the office of [facility].    I understand that my insurance will be billed for this visit.   I have read or had this consent read to me. . I understand the contents of this consent, which adequately explains the benefits and risks of the Services being provided via telemedicine.  . I have been provided ample opportunity to ask questions regarding this consent and the Services and have had my questions answered to my satisfaction. . I give my informed consent for the services to be provided through the use of telemedicine in my medical care  By participating in this telemedicine visit I agree to the above.  

## 2019-02-05 NOTE — Addendum Note (Signed)
Addended by: Julio Sicks on: 02/05/2019 04:28 PM   Modules accepted: Orders

## 2019-02-05 NOTE — Patient Instructions (Signed)
Medication Instructions:  Your physician recommends that you continue on your current medications as directed. Please refer to the Current Medication list given to you today.  If you need a refill on your cardiac medications before your next appointment, please call your pharmacy.   Lab work: None If you have labs (blood work) drawn today and your tests are completely normal, you will receive your results only by: . MyChart Message (if you have MyChart) OR . A paper copy in the mail If you have any lab test that is abnormal or we need to change your treatment, we will call you to review the results.  Testing/Procedures: Your physician has recommended that you wear an event monitor for 14 days. Event monitors are medical devices that record the heart's electrical activity. Doctors most often us these monitors to diagnose arrhythmias. Arrhythmias are problems with the speed or rhythm of the heartbeat. The monitor is a small, portable device. You can wear one while you do your normal daily activities. This is usually used to diagnose what is causing palpitations/syncope (passing out).   Follow-Up: Your physician recommends that you schedule a follow-up appointment as needed with Dr. Skains.   Any Other Special Instructions Will Be Listed Below (If Applicable).    
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
## 2019-02-06 ENCOUNTER — Telehealth: Payer: Self-pay | Admitting: Radiology

## 2019-02-06 NOTE — Telephone Encounter (Signed)
Enrolled patient for a 14 day Zio monitor to be mailed. Brief instructions were gone over and patient knows to expect the monitor to arrive in 3-4 days 

## 2019-02-11 ENCOUNTER — Telehealth: Payer: 59 | Admitting: Cardiology

## 2019-02-11 ENCOUNTER — Ambulatory Visit (INDEPENDENT_AMBULATORY_CARE_PROVIDER_SITE_OTHER): Payer: 59

## 2019-02-11 DIAGNOSIS — R002 Palpitations: Secondary | ICD-10-CM | POA: Diagnosis not present

## 2019-03-11 ENCOUNTER — Other Ambulatory Visit: Payer: Self-pay

## 2019-09-23 DIAGNOSIS — F4323 Adjustment disorder with mixed anxiety and depressed mood: Secondary | ICD-10-CM | POA: Diagnosis not present

## 2019-10-21 DIAGNOSIS — F4323 Adjustment disorder with mixed anxiety and depressed mood: Secondary | ICD-10-CM | POA: Diagnosis not present

## 2019-10-28 DIAGNOSIS — F419 Anxiety disorder, unspecified: Secondary | ICD-10-CM | POA: Diagnosis not present

## 2019-10-28 DIAGNOSIS — E063 Autoimmune thyroiditis: Secondary | ICD-10-CM | POA: Diagnosis not present

## 2019-10-28 DIAGNOSIS — E039 Hypothyroidism, unspecified: Secondary | ICD-10-CM | POA: Diagnosis not present

## 2019-10-28 DIAGNOSIS — E559 Vitamin D deficiency, unspecified: Secondary | ICD-10-CM | POA: Diagnosis not present

## 2019-11-04 DIAGNOSIS — F4323 Adjustment disorder with mixed anxiety and depressed mood: Secondary | ICD-10-CM | POA: Diagnosis not present

## 2019-11-11 DIAGNOSIS — Z0000 Encounter for general adult medical examination without abnormal findings: Secondary | ICD-10-CM | POA: Diagnosis not present

## 2019-11-11 DIAGNOSIS — E559 Vitamin D deficiency, unspecified: Secondary | ICD-10-CM | POA: Diagnosis not present

## 2019-11-11 DIAGNOSIS — Z13220 Encounter for screening for lipoid disorders: Secondary | ICD-10-CM | POA: Diagnosis not present

## 2019-11-11 DIAGNOSIS — Z23 Encounter for immunization: Secondary | ICD-10-CM | POA: Diagnosis not present

## 2019-11-18 DIAGNOSIS — F4323 Adjustment disorder with mixed anxiety and depressed mood: Secondary | ICD-10-CM | POA: Diagnosis not present

## 2019-12-02 DIAGNOSIS — F4323 Adjustment disorder with mixed anxiety and depressed mood: Secondary | ICD-10-CM | POA: Diagnosis not present

## 2019-12-11 DIAGNOSIS — Z23 Encounter for immunization: Secondary | ICD-10-CM | POA: Diagnosis not present

## 2019-12-17 DIAGNOSIS — F4323 Adjustment disorder with mixed anxiety and depressed mood: Secondary | ICD-10-CM | POA: Diagnosis not present

## 2019-12-30 DIAGNOSIS — Z01419 Encounter for gynecological examination (general) (routine) without abnormal findings: Secondary | ICD-10-CM | POA: Diagnosis not present

## 2019-12-30 DIAGNOSIS — F4323 Adjustment disorder with mixed anxiety and depressed mood: Secondary | ICD-10-CM | POA: Diagnosis not present

## 2019-12-30 DIAGNOSIS — Z30011 Encounter for initial prescription of contraceptive pills: Secondary | ICD-10-CM | POA: Diagnosis not present

## 2019-12-30 DIAGNOSIS — A609 Anogenital herpesviral infection, unspecified: Secondary | ICD-10-CM | POA: Diagnosis not present

## 2020-01-01 DIAGNOSIS — Z23 Encounter for immunization: Secondary | ICD-10-CM | POA: Diagnosis not present

## 2020-01-13 DIAGNOSIS — F4323 Adjustment disorder with mixed anxiety and depressed mood: Secondary | ICD-10-CM | POA: Diagnosis not present

## 2020-01-27 DIAGNOSIS — F4323 Adjustment disorder with mixed anxiety and depressed mood: Secondary | ICD-10-CM | POA: Diagnosis not present

## 2020-02-10 DIAGNOSIS — F4323 Adjustment disorder with mixed anxiety and depressed mood: Secondary | ICD-10-CM | POA: Diagnosis not present

## 2020-02-24 DIAGNOSIS — F4323 Adjustment disorder with mixed anxiety and depressed mood: Secondary | ICD-10-CM | POA: Diagnosis not present

## 2020-03-09 DIAGNOSIS — F4323 Adjustment disorder with mixed anxiety and depressed mood: Secondary | ICD-10-CM | POA: Diagnosis not present

## 2020-04-01 DIAGNOSIS — E039 Hypothyroidism, unspecified: Secondary | ICD-10-CM | POA: Diagnosis not present

## 2020-04-06 DIAGNOSIS — F4323 Adjustment disorder with mixed anxiety and depressed mood: Secondary | ICD-10-CM | POA: Diagnosis not present

## 2020-04-08 DIAGNOSIS — E063 Autoimmune thyroiditis: Secondary | ICD-10-CM | POA: Diagnosis not present

## 2020-04-08 DIAGNOSIS — E039 Hypothyroidism, unspecified: Secondary | ICD-10-CM | POA: Diagnosis not present

## 2020-04-20 DIAGNOSIS — F4323 Adjustment disorder with mixed anxiety and depressed mood: Secondary | ICD-10-CM | POA: Diagnosis not present

## 2020-04-30 DIAGNOSIS — Z3009 Encounter for other general counseling and advice on contraception: Secondary | ICD-10-CM | POA: Diagnosis not present

## 2020-04-30 DIAGNOSIS — Z3041 Encounter for surveillance of contraceptive pills: Secondary | ICD-10-CM | POA: Diagnosis not present

## 2020-05-12 DIAGNOSIS — F4323 Adjustment disorder with mixed anxiety and depressed mood: Secondary | ICD-10-CM | POA: Diagnosis not present

## 2020-05-26 DIAGNOSIS — Z30430 Encounter for insertion of intrauterine contraceptive device: Secondary | ICD-10-CM | POA: Diagnosis not present

## 2020-05-26 DIAGNOSIS — Z3202 Encounter for pregnancy test, result negative: Secondary | ICD-10-CM | POA: Diagnosis not present

## 2020-08-06 DIAGNOSIS — Z30432 Encounter for removal of intrauterine contraceptive device: Secondary | ICD-10-CM | POA: Diagnosis not present

## 2020-08-06 DIAGNOSIS — Z30016 Encounter for initial prescription of transdermal patch hormonal contraceptive device: Secondary | ICD-10-CM | POA: Diagnosis not present

## 2020-09-21 DIAGNOSIS — L720 Epidermal cyst: Secondary | ICD-10-CM | POA: Diagnosis not present

## 2020-11-09 DIAGNOSIS — Z113 Encounter for screening for infections with a predominantly sexual mode of transmission: Secondary | ICD-10-CM | POA: Diagnosis not present

## 2020-11-09 DIAGNOSIS — Z3045 Encounter for surveillance of transdermal patch hormonal contraceptive device: Secondary | ICD-10-CM | POA: Diagnosis not present

## 2021-02-22 ENCOUNTER — Other Ambulatory Visit: Payer: Self-pay | Admitting: Obstetrics and Gynecology

## 2021-02-22 DIAGNOSIS — Z01419 Encounter for gynecological examination (general) (routine) without abnormal findings: Secondary | ICD-10-CM | POA: Diagnosis not present

## 2021-02-22 DIAGNOSIS — Z1231 Encounter for screening mammogram for malignant neoplasm of breast: Secondary | ICD-10-CM

## 2021-03-31 DIAGNOSIS — E039 Hypothyroidism, unspecified: Secondary | ICD-10-CM | POA: Diagnosis not present

## 2021-04-08 DIAGNOSIS — E039 Hypothyroidism, unspecified: Secondary | ICD-10-CM | POA: Diagnosis not present

## 2021-04-08 DIAGNOSIS — E063 Autoimmune thyroiditis: Secondary | ICD-10-CM | POA: Diagnosis not present

## 2021-04-19 ENCOUNTER — Ambulatory Visit
Admission: RE | Admit: 2021-04-19 | Discharge: 2021-04-19 | Disposition: A | Discharge status: Home / Self Care | Payer: BC Managed Care – PPO | Source: Ambulatory Visit | Attending: Obstetrics and Gynecology | Admitting: Obstetrics and Gynecology

## 2021-04-19 ENCOUNTER — Other Ambulatory Visit: Payer: Self-pay

## 2021-04-19 DIAGNOSIS — Z1231 Encounter for screening mammogram for malignant neoplasm of breast: Secondary | ICD-10-CM | POA: Diagnosis not present

## 2021-10-31 DIAGNOSIS — M62838 Other muscle spasm: Secondary | ICD-10-CM | POA: Diagnosis not present

## 2021-10-31 DIAGNOSIS — G43109 Migraine with aura, not intractable, without status migrainosus: Secondary | ICD-10-CM | POA: Diagnosis not present

## 2022-04-07 DIAGNOSIS — Z01419 Encounter for gynecological examination (general) (routine) without abnormal findings: Secondary | ICD-10-CM | POA: Diagnosis not present

## 2022-04-07 DIAGNOSIS — E039 Hypothyroidism, unspecified: Secondary | ICD-10-CM | POA: Diagnosis not present

## 2022-04-14 DIAGNOSIS — E063 Autoimmune thyroiditis: Secondary | ICD-10-CM | POA: Diagnosis not present

## 2022-04-14 DIAGNOSIS — E039 Hypothyroidism, unspecified: Secondary | ICD-10-CM | POA: Diagnosis not present

## 2022-05-12 DIAGNOSIS — H00015 Hordeolum externum left lower eyelid: Secondary | ICD-10-CM | POA: Diagnosis not present

## 2022-06-07 DIAGNOSIS — D225 Melanocytic nevi of trunk: Secondary | ICD-10-CM | POA: Diagnosis not present

## 2022-06-07 DIAGNOSIS — L814 Other melanin hyperpigmentation: Secondary | ICD-10-CM | POA: Diagnosis not present

## 2022-06-07 DIAGNOSIS — D485 Neoplasm of uncertain behavior of skin: Secondary | ICD-10-CM | POA: Diagnosis not present

## 2022-06-07 DIAGNOSIS — L821 Other seborrheic keratosis: Secondary | ICD-10-CM | POA: Diagnosis not present

## 2022-06-09 ENCOUNTER — Other Ambulatory Visit: Payer: Self-pay | Admitting: Obstetrics and Gynecology

## 2022-06-09 DIAGNOSIS — Z1231 Encounter for screening mammogram for malignant neoplasm of breast: Secondary | ICD-10-CM

## 2022-06-09 DIAGNOSIS — E039 Hypothyroidism, unspecified: Secondary | ICD-10-CM | POA: Diagnosis not present

## 2022-07-06 ENCOUNTER — Ambulatory Visit
Admission: RE | Admit: 2022-07-06 | Discharge: 2022-07-06 | Disposition: A | Discharge status: Home / Self Care | Payer: BC Managed Care – PPO | Source: Ambulatory Visit | Attending: Obstetrics and Gynecology | Admitting: Obstetrics and Gynecology

## 2022-07-06 DIAGNOSIS — Z1231 Encounter for screening mammogram for malignant neoplasm of breast: Secondary | ICD-10-CM

## 2022-07-10 ENCOUNTER — Other Ambulatory Visit: Payer: Self-pay | Admitting: Obstetrics and Gynecology

## 2022-07-10 DIAGNOSIS — R928 Other abnormal and inconclusive findings on diagnostic imaging of breast: Secondary | ICD-10-CM

## 2022-07-20 ENCOUNTER — Ambulatory Visit
Admission: RE | Admit: 2022-07-20 | Discharge: 2022-07-20 | Disposition: A | Discharge status: Home / Self Care | Payer: BC Managed Care – PPO | Source: Ambulatory Visit | Attending: Obstetrics and Gynecology | Admitting: Obstetrics and Gynecology

## 2022-07-20 ENCOUNTER — Other Ambulatory Visit: Payer: Self-pay | Admitting: Obstetrics and Gynecology

## 2022-07-20 DIAGNOSIS — N6320 Unspecified lump in the left breast, unspecified quadrant: Secondary | ICD-10-CM

## 2022-07-20 DIAGNOSIS — R928 Other abnormal and inconclusive findings on diagnostic imaging of breast: Secondary | ICD-10-CM

## 2022-07-20 DIAGNOSIS — R922 Inconclusive mammogram: Secondary | ICD-10-CM | POA: Diagnosis not present

## 2022-07-27 DIAGNOSIS — E039 Hypothyroidism, unspecified: Secondary | ICD-10-CM | POA: Diagnosis not present

## 2022-11-17 DIAGNOSIS — H00012 Hordeolum externum right lower eyelid: Secondary | ICD-10-CM | POA: Diagnosis not present

## 2022-11-17 DIAGNOSIS — J3081 Allergic rhinitis due to animal (cat) (dog) hair and dander: Secondary | ICD-10-CM | POA: Diagnosis not present

## 2023-01-26 ENCOUNTER — Ambulatory Visit
Admission: RE | Admit: 2023-01-26 | Discharge: 2023-01-26 | Disposition: A | Discharge status: Home / Self Care | Payer: BC Managed Care – PPO | Source: Ambulatory Visit | Attending: Obstetrics and Gynecology | Admitting: Obstetrics and Gynecology

## 2023-01-26 DIAGNOSIS — N6320 Unspecified lump in the left breast, unspecified quadrant: Secondary | ICD-10-CM

## 2023-01-26 DIAGNOSIS — N63 Unspecified lump in unspecified breast: Secondary | ICD-10-CM | POA: Diagnosis not present

## 2023-01-29 IMAGING — MG MM DIGITAL SCREENING BILAT W/ TOMO AND CAD
8 series · 8 of 24 positions shown · non-contrast
Comparison: None.

CLINICAL DATA: Screening.

EXAM:
DIGITAL SCREENING BILATERAL MAMMOGRAM WITH TOMOSYNTHESIS AND CAD
TECHNIQUE: Bilateral screening digital craniocaudal and mediolateral oblique
mammograms were obtained. Bilateral screening digital breast
tomosynthesis was performed. The images were evaluated with
computer-aided detection.

[L CC synth-2D]
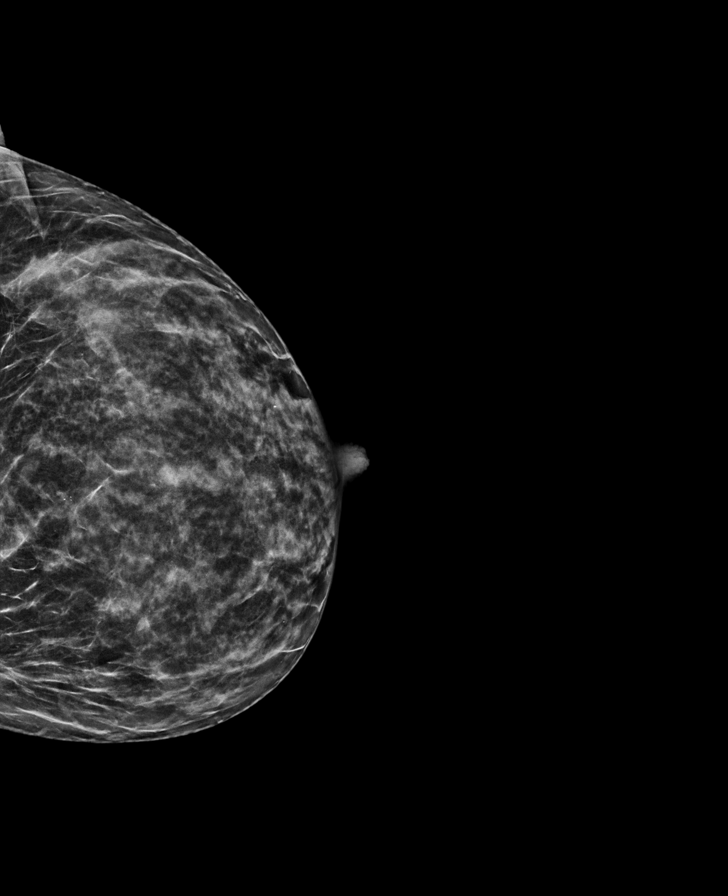

[L MLO synth-2D]
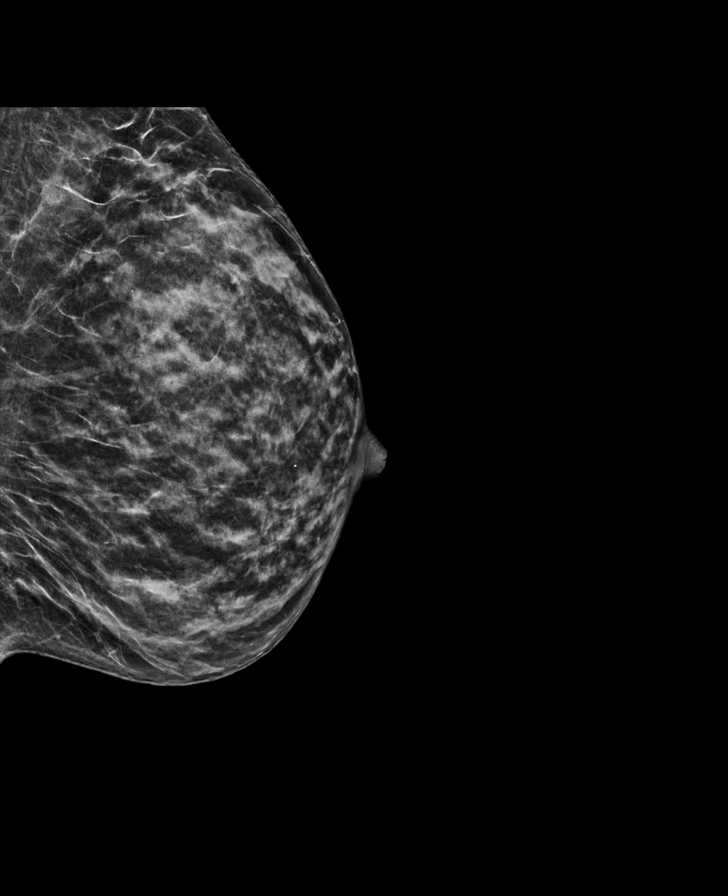

[R MLO synth-2D]
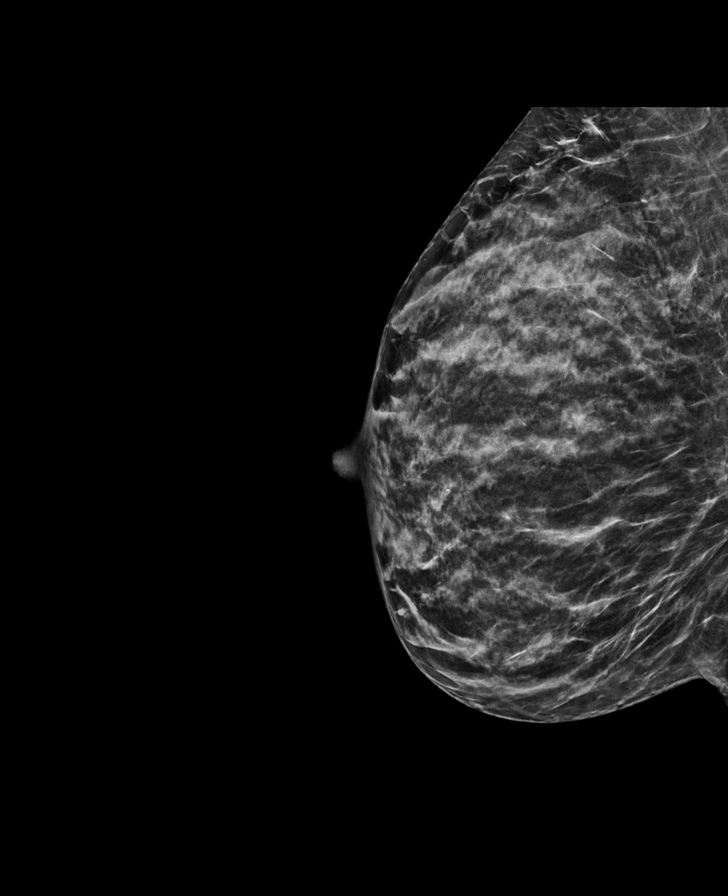

[R CC synth-2D]
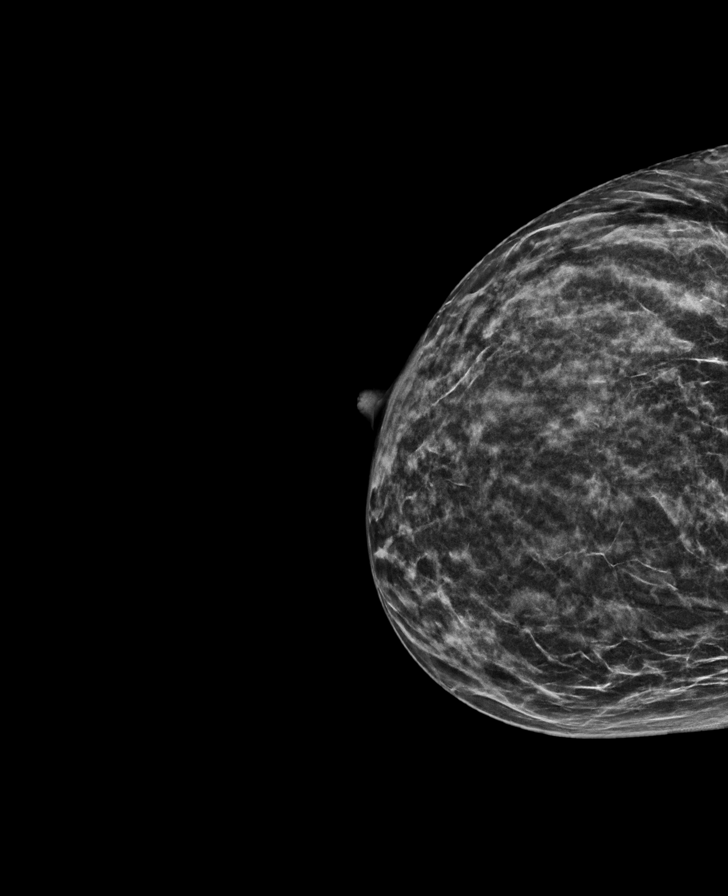

[L MLO tomo · tomo slice 28/55.0]
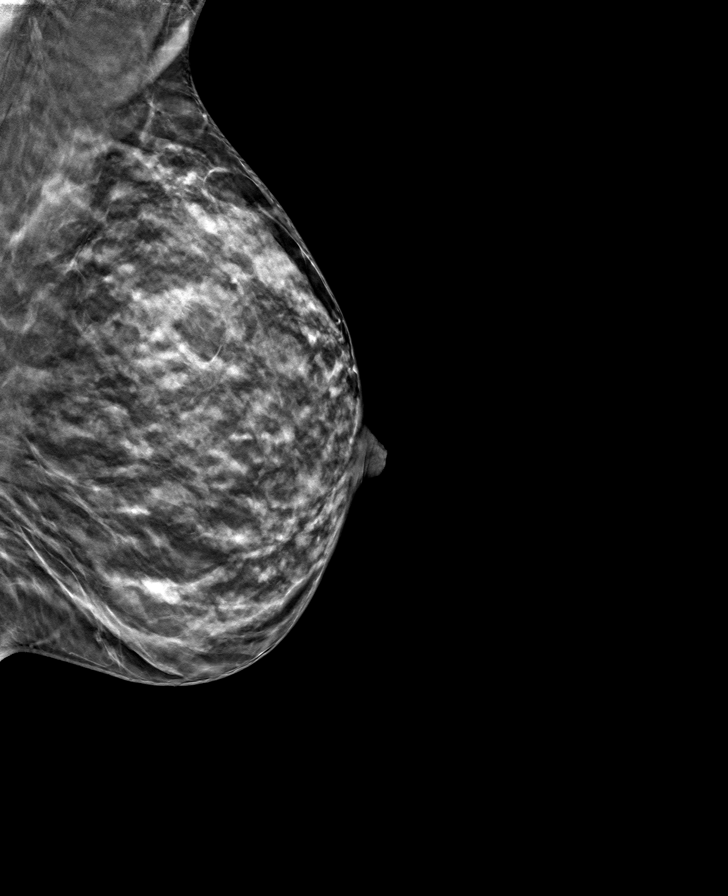

[R MLO tomo · tomo slice 29/57.0]
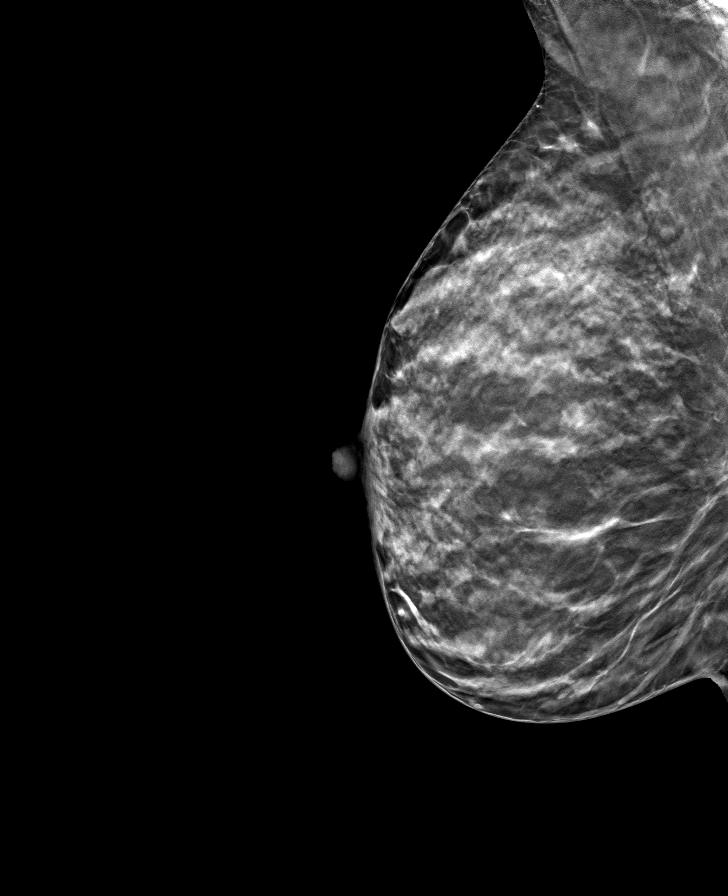

[L CC tomo · tomo slice 29/56.0]
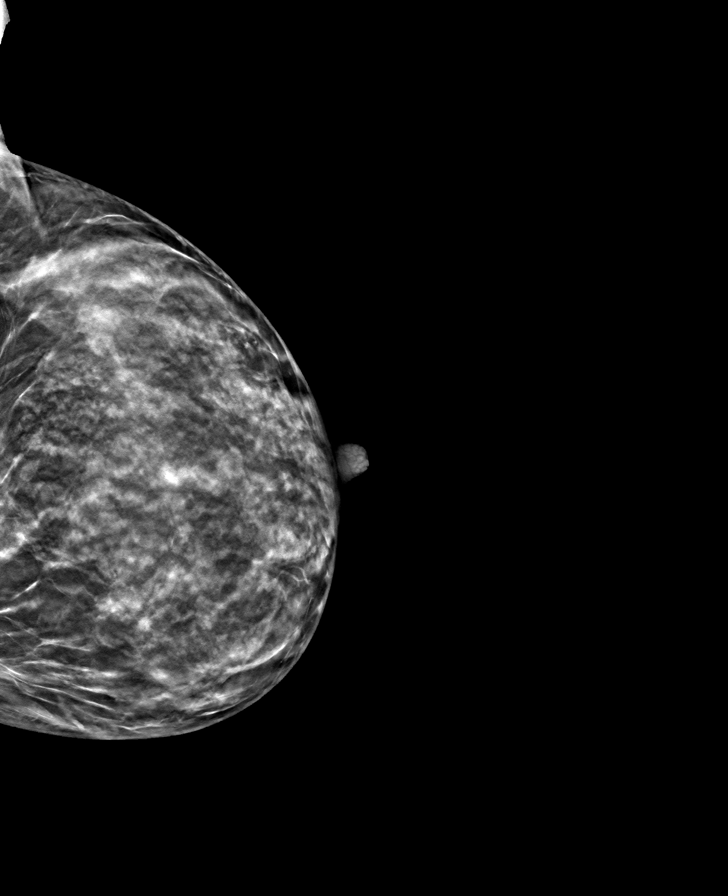

[R CC tomo · tomo slice 29/57.0]
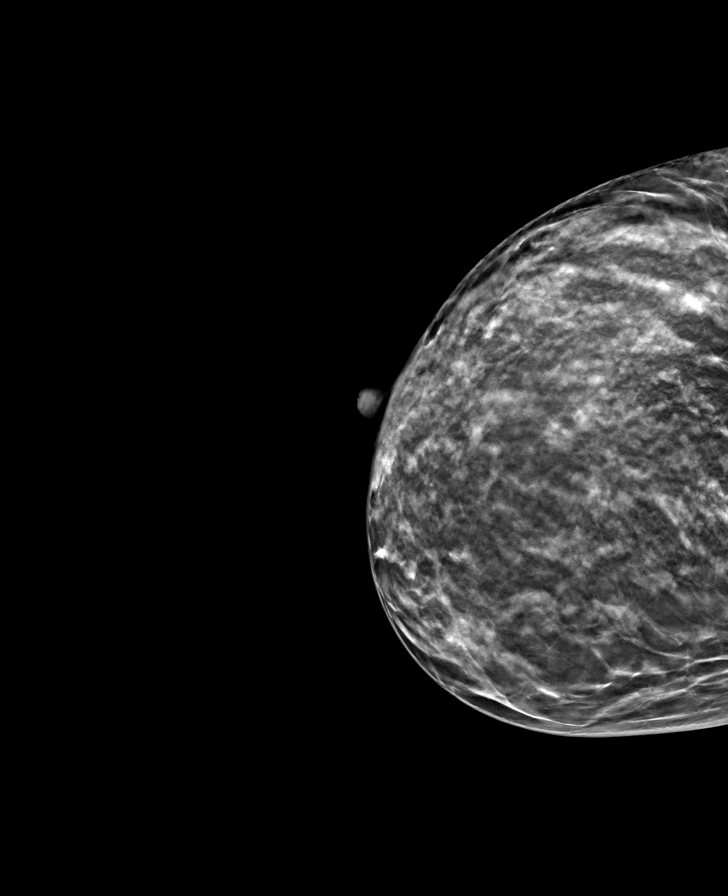

[8 of 24 positions shown; findings below may reference images not displayed]

ACR Breast Density Category c: The breast tissue is heterogeneously
dense, which may obscure small masses
FINDINGS: There are no findings suspicious for malignancy.
IMPRESSION: No mammographic evidence of malignancy. A result letter of this
screening mammogram will be mailed directly to the patient.

RECOMMENDATION:
Screening mammogram in one year. (Code:C8-T-HNK)

BI-RADS CATEGORY  1: Negative.

## 2023-02-14 ENCOUNTER — Other Ambulatory Visit: Payer: Self-pay | Admitting: Obstetrics and Gynecology

## 2023-02-14 DIAGNOSIS — N6320 Unspecified lump in the left breast, unspecified quadrant: Secondary | ICD-10-CM

## 2023-04-09 DIAGNOSIS — E039 Hypothyroidism, unspecified: Secondary | ICD-10-CM | POA: Diagnosis not present

## 2023-04-17 DIAGNOSIS — Z833 Family history of diabetes mellitus: Secondary | ICD-10-CM | POA: Diagnosis not present

## 2023-04-17 DIAGNOSIS — E063 Autoimmune thyroiditis: Secondary | ICD-10-CM | POA: Diagnosis not present

## 2023-04-17 DIAGNOSIS — R5383 Other fatigue: Secondary | ICD-10-CM | POA: Diagnosis not present

## 2023-04-17 DIAGNOSIS — E039 Hypothyroidism, unspecified: Secondary | ICD-10-CM | POA: Diagnosis not present

## 2023-04-20 DIAGNOSIS — Z01419 Encounter for gynecological examination (general) (routine) without abnormal findings: Secondary | ICD-10-CM | POA: Diagnosis not present

## 2023-04-20 DIAGNOSIS — E039 Hypothyroidism, unspecified: Secondary | ICD-10-CM | POA: Diagnosis not present

## 2023-04-20 DIAGNOSIS — R5383 Other fatigue: Secondary | ICD-10-CM | POA: Diagnosis not present

## 2023-04-20 DIAGNOSIS — N951 Menopausal and female climacteric states: Secondary | ICD-10-CM | POA: Diagnosis not present

## 2023-04-20 DIAGNOSIS — Z1151 Encounter for screening for human papillomavirus (HPV): Secondary | ICD-10-CM | POA: Diagnosis not present

## 2023-04-20 DIAGNOSIS — Z124 Encounter for screening for malignant neoplasm of cervix: Secondary | ICD-10-CM | POA: Diagnosis not present

## 2023-06-12 DIAGNOSIS — D225 Melanocytic nevi of trunk: Secondary | ICD-10-CM | POA: Diagnosis not present

## 2023-06-12 DIAGNOSIS — L821 Other seborrheic keratosis: Secondary | ICD-10-CM | POA: Diagnosis not present

## 2023-06-12 DIAGNOSIS — L814 Other melanin hyperpigmentation: Secondary | ICD-10-CM | POA: Diagnosis not present

## 2023-07-25 DIAGNOSIS — N951 Menopausal and female climacteric states: Secondary | ICD-10-CM | POA: Diagnosis not present

## 2023-08-27 ENCOUNTER — Other Ambulatory Visit: Payer: BC Managed Care – PPO

## 2023-09-24 ENCOUNTER — Other Ambulatory Visit: Payer: BC Managed Care – PPO

## 2023-10-02 ENCOUNTER — Other Ambulatory Visit: Payer: BC Managed Care – PPO

## 2023-10-22 ENCOUNTER — Ambulatory Visit
Admission: RE | Admit: 2023-10-22 | Discharge: 2023-10-22 | Disposition: A | Discharge status: Home / Self Care | Payer: BC Managed Care – PPO | Source: Ambulatory Visit | Attending: Obstetrics and Gynecology | Admitting: Obstetrics and Gynecology

## 2023-10-22 DIAGNOSIS — N6320 Unspecified lump in the left breast, unspecified quadrant: Secondary | ICD-10-CM

## 2023-10-22 DIAGNOSIS — N6325 Unspecified lump in the left breast, overlapping quadrants: Secondary | ICD-10-CM | POA: Diagnosis not present

## 2024-02-07 DIAGNOSIS — M9901 Segmental and somatic dysfunction of cervical region: Secondary | ICD-10-CM | POA: Diagnosis not present

## 2024-02-07 DIAGNOSIS — M7912 Myalgia of auxiliary muscles, head and neck: Secondary | ICD-10-CM | POA: Diagnosis not present

## 2024-02-07 DIAGNOSIS — G44209 Tension-type headache, unspecified, not intractable: Secondary | ICD-10-CM | POA: Diagnosis not present

## 2024-02-07 DIAGNOSIS — M9902 Segmental and somatic dysfunction of thoracic region: Secondary | ICD-10-CM | POA: Diagnosis not present

## 2024-02-12 DIAGNOSIS — M9902 Segmental and somatic dysfunction of thoracic region: Secondary | ICD-10-CM | POA: Diagnosis not present

## 2024-02-12 DIAGNOSIS — G44209 Tension-type headache, unspecified, not intractable: Secondary | ICD-10-CM | POA: Diagnosis not present

## 2024-02-12 DIAGNOSIS — M7912 Myalgia of auxiliary muscles, head and neck: Secondary | ICD-10-CM | POA: Diagnosis not present

## 2024-02-12 DIAGNOSIS — M9901 Segmental and somatic dysfunction of cervical region: Secondary | ICD-10-CM | POA: Diagnosis not present

## 2024-02-14 DIAGNOSIS — M7912 Myalgia of auxiliary muscles, head and neck: Secondary | ICD-10-CM | POA: Diagnosis not present

## 2024-02-14 DIAGNOSIS — G44209 Tension-type headache, unspecified, not intractable: Secondary | ICD-10-CM | POA: Diagnosis not present

## 2024-02-14 DIAGNOSIS — M9901 Segmental and somatic dysfunction of cervical region: Secondary | ICD-10-CM | POA: Diagnosis not present

## 2024-02-14 DIAGNOSIS — M9902 Segmental and somatic dysfunction of thoracic region: Secondary | ICD-10-CM | POA: Diagnosis not present

## 2024-02-18 DIAGNOSIS — F4323 Adjustment disorder with mixed anxiety and depressed mood: Secondary | ICD-10-CM | POA: Diagnosis not present

## 2024-02-18 DIAGNOSIS — Z630 Problems in relationship with spouse or partner: Secondary | ICD-10-CM | POA: Diagnosis not present

## 2024-02-19 DIAGNOSIS — M9901 Segmental and somatic dysfunction of cervical region: Secondary | ICD-10-CM | POA: Diagnosis not present

## 2024-02-19 DIAGNOSIS — M9902 Segmental and somatic dysfunction of thoracic region: Secondary | ICD-10-CM | POA: Diagnosis not present

## 2024-02-19 DIAGNOSIS — G44209 Tension-type headache, unspecified, not intractable: Secondary | ICD-10-CM | POA: Diagnosis not present

## 2024-02-19 DIAGNOSIS — M7912 Myalgia of auxiliary muscles, head and neck: Secondary | ICD-10-CM | POA: Diagnosis not present

## 2024-02-25 DIAGNOSIS — Z630 Problems in relationship with spouse or partner: Secondary | ICD-10-CM | POA: Diagnosis not present

## 2024-02-25 DIAGNOSIS — F4323 Adjustment disorder with mixed anxiety and depressed mood: Secondary | ICD-10-CM | POA: Diagnosis not present

## 2024-02-26 DIAGNOSIS — M9902 Segmental and somatic dysfunction of thoracic region: Secondary | ICD-10-CM | POA: Diagnosis not present

## 2024-02-26 DIAGNOSIS — M7912 Myalgia of auxiliary muscles, head and neck: Secondary | ICD-10-CM | POA: Diagnosis not present

## 2024-02-26 DIAGNOSIS — G44209 Tension-type headache, unspecified, not intractable: Secondary | ICD-10-CM | POA: Diagnosis not present

## 2024-02-26 DIAGNOSIS — M9901 Segmental and somatic dysfunction of cervical region: Secondary | ICD-10-CM | POA: Diagnosis not present

## 2024-02-28 DIAGNOSIS — G44209 Tension-type headache, unspecified, not intractable: Secondary | ICD-10-CM | POA: Diagnosis not present

## 2024-02-28 DIAGNOSIS — M9902 Segmental and somatic dysfunction of thoracic region: Secondary | ICD-10-CM | POA: Diagnosis not present

## 2024-02-28 DIAGNOSIS — M9901 Segmental and somatic dysfunction of cervical region: Secondary | ICD-10-CM | POA: Diagnosis not present

## 2024-02-28 DIAGNOSIS — M7912 Myalgia of auxiliary muscles, head and neck: Secondary | ICD-10-CM | POA: Diagnosis not present

## 2024-03-10 DIAGNOSIS — Z630 Problems in relationship with spouse or partner: Secondary | ICD-10-CM | POA: Diagnosis not present

## 2024-03-10 DIAGNOSIS — F4323 Adjustment disorder with mixed anxiety and depressed mood: Secondary | ICD-10-CM | POA: Diagnosis not present

## 2024-03-11 DIAGNOSIS — G44209 Tension-type headache, unspecified, not intractable: Secondary | ICD-10-CM | POA: Diagnosis not present

## 2024-03-11 DIAGNOSIS — M9902 Segmental and somatic dysfunction of thoracic region: Secondary | ICD-10-CM | POA: Diagnosis not present

## 2024-03-11 DIAGNOSIS — M7912 Myalgia of auxiliary muscles, head and neck: Secondary | ICD-10-CM | POA: Diagnosis not present

## 2024-03-11 DIAGNOSIS — M9901 Segmental and somatic dysfunction of cervical region: Secondary | ICD-10-CM | POA: Diagnosis not present

## 2024-03-17 DIAGNOSIS — G44209 Tension-type headache, unspecified, not intractable: Secondary | ICD-10-CM | POA: Diagnosis not present

## 2024-03-17 DIAGNOSIS — M9901 Segmental and somatic dysfunction of cervical region: Secondary | ICD-10-CM | POA: Diagnosis not present

## 2024-03-17 DIAGNOSIS — M7912 Myalgia of auxiliary muscles, head and neck: Secondary | ICD-10-CM | POA: Diagnosis not present

## 2024-03-17 DIAGNOSIS — M9902 Segmental and somatic dysfunction of thoracic region: Secondary | ICD-10-CM | POA: Diagnosis not present

## 2024-03-19 DIAGNOSIS — F4323 Adjustment disorder with mixed anxiety and depressed mood: Secondary | ICD-10-CM | POA: Diagnosis not present

## 2024-03-19 DIAGNOSIS — Z630 Problems in relationship with spouse or partner: Secondary | ICD-10-CM | POA: Diagnosis not present

## 2024-03-27 DIAGNOSIS — F4323 Adjustment disorder with mixed anxiety and depressed mood: Secondary | ICD-10-CM | POA: Diagnosis not present

## 2024-03-27 DIAGNOSIS — Z630 Problems in relationship with spouse or partner: Secondary | ICD-10-CM | POA: Diagnosis not present

## 2024-03-28 DIAGNOSIS — M9901 Segmental and somatic dysfunction of cervical region: Secondary | ICD-10-CM | POA: Diagnosis not present

## 2024-03-28 DIAGNOSIS — M7912 Myalgia of auxiliary muscles, head and neck: Secondary | ICD-10-CM | POA: Diagnosis not present

## 2024-03-28 DIAGNOSIS — M9902 Segmental and somatic dysfunction of thoracic region: Secondary | ICD-10-CM | POA: Diagnosis not present

## 2024-03-28 DIAGNOSIS — G44209 Tension-type headache, unspecified, not intractable: Secondary | ICD-10-CM | POA: Diagnosis not present

## 2024-04-07 DIAGNOSIS — G44209 Tension-type headache, unspecified, not intractable: Secondary | ICD-10-CM | POA: Diagnosis not present

## 2024-04-07 DIAGNOSIS — M9902 Segmental and somatic dysfunction of thoracic region: Secondary | ICD-10-CM | POA: Diagnosis not present

## 2024-04-07 DIAGNOSIS — M9901 Segmental and somatic dysfunction of cervical region: Secondary | ICD-10-CM | POA: Diagnosis not present

## 2024-04-07 DIAGNOSIS — M7912 Myalgia of auxiliary muscles, head and neck: Secondary | ICD-10-CM | POA: Diagnosis not present

## 2024-04-08 DIAGNOSIS — F4323 Adjustment disorder with mixed anxiety and depressed mood: Secondary | ICD-10-CM | POA: Diagnosis not present

## 2024-04-08 DIAGNOSIS — Z630 Problems in relationship with spouse or partner: Secondary | ICD-10-CM | POA: Diagnosis not present

## 2024-04-10 DIAGNOSIS — E063 Autoimmune thyroiditis: Secondary | ICD-10-CM | POA: Diagnosis not present

## 2024-04-10 DIAGNOSIS — E039 Hypothyroidism, unspecified: Secondary | ICD-10-CM | POA: Diagnosis not present

## 2024-04-17 DIAGNOSIS — E039 Hypothyroidism, unspecified: Secondary | ICD-10-CM | POA: Diagnosis not present

## 2024-04-17 DIAGNOSIS — E063 Autoimmune thyroiditis: Secondary | ICD-10-CM | POA: Diagnosis not present

## 2024-04-18 DIAGNOSIS — Z630 Problems in relationship with spouse or partner: Secondary | ICD-10-CM | POA: Diagnosis not present

## 2024-04-18 DIAGNOSIS — F4323 Adjustment disorder with mixed anxiety and depressed mood: Secondary | ICD-10-CM | POA: Diagnosis not present

## 2024-04-24 DIAGNOSIS — M9901 Segmental and somatic dysfunction of cervical region: Secondary | ICD-10-CM | POA: Diagnosis not present

## 2024-04-24 DIAGNOSIS — M7912 Myalgia of auxiliary muscles, head and neck: Secondary | ICD-10-CM | POA: Diagnosis not present

## 2024-04-24 DIAGNOSIS — F4323 Adjustment disorder with mixed anxiety and depressed mood: Secondary | ICD-10-CM | POA: Diagnosis not present

## 2024-04-24 DIAGNOSIS — M9902 Segmental and somatic dysfunction of thoracic region: Secondary | ICD-10-CM | POA: Diagnosis not present

## 2024-04-24 DIAGNOSIS — Z01419 Encounter for gynecological examination (general) (routine) without abnormal findings: Secondary | ICD-10-CM | POA: Diagnosis not present

## 2024-04-24 DIAGNOSIS — N951 Menopausal and female climacteric states: Secondary | ICD-10-CM | POA: Diagnosis not present

## 2024-04-24 DIAGNOSIS — G44209 Tension-type headache, unspecified, not intractable: Secondary | ICD-10-CM | POA: Diagnosis not present

## 2024-04-24 DIAGNOSIS — Z630 Problems in relationship with spouse or partner: Secondary | ICD-10-CM | POA: Diagnosis not present

## 2024-05-01 DIAGNOSIS — M7912 Myalgia of auxiliary muscles, head and neck: Secondary | ICD-10-CM | POA: Diagnosis not present

## 2024-05-01 DIAGNOSIS — M9902 Segmental and somatic dysfunction of thoracic region: Secondary | ICD-10-CM | POA: Diagnosis not present

## 2024-05-01 DIAGNOSIS — M9901 Segmental and somatic dysfunction of cervical region: Secondary | ICD-10-CM | POA: Diagnosis not present

## 2024-05-01 DIAGNOSIS — G44209 Tension-type headache, unspecified, not intractable: Secondary | ICD-10-CM | POA: Diagnosis not present

## 2024-05-12 DIAGNOSIS — M7912 Myalgia of auxiliary muscles, head and neck: Secondary | ICD-10-CM | POA: Diagnosis not present

## 2024-05-12 DIAGNOSIS — G44209 Tension-type headache, unspecified, not intractable: Secondary | ICD-10-CM | POA: Diagnosis not present

## 2024-05-12 DIAGNOSIS — M9902 Segmental and somatic dysfunction of thoracic region: Secondary | ICD-10-CM | POA: Diagnosis not present

## 2024-05-12 DIAGNOSIS — M9901 Segmental and somatic dysfunction of cervical region: Secondary | ICD-10-CM | POA: Diagnosis not present

## 2024-05-16 DIAGNOSIS — F4323 Adjustment disorder with mixed anxiety and depressed mood: Secondary | ICD-10-CM | POA: Diagnosis not present

## 2024-05-16 DIAGNOSIS — Z630 Problems in relationship with spouse or partner: Secondary | ICD-10-CM | POA: Diagnosis not present

## 2024-05-20 DIAGNOSIS — G44209 Tension-type headache, unspecified, not intractable: Secondary | ICD-10-CM | POA: Diagnosis not present

## 2024-05-20 DIAGNOSIS — M7912 Myalgia of auxiliary muscles, head and neck: Secondary | ICD-10-CM | POA: Diagnosis not present

## 2024-05-20 DIAGNOSIS — M9901 Segmental and somatic dysfunction of cervical region: Secondary | ICD-10-CM | POA: Diagnosis not present

## 2024-05-20 DIAGNOSIS — M9902 Segmental and somatic dysfunction of thoracic region: Secondary | ICD-10-CM | POA: Diagnosis not present

## 2024-05-23 DIAGNOSIS — Z630 Problems in relationship with spouse or partner: Secondary | ICD-10-CM | POA: Diagnosis not present

## 2024-05-23 DIAGNOSIS — F4323 Adjustment disorder with mixed anxiety and depressed mood: Secondary | ICD-10-CM | POA: Diagnosis not present

## 2024-05-26 DIAGNOSIS — M7912 Myalgia of auxiliary muscles, head and neck: Secondary | ICD-10-CM | POA: Diagnosis not present

## 2024-05-26 DIAGNOSIS — G44209 Tension-type headache, unspecified, not intractable: Secondary | ICD-10-CM | POA: Diagnosis not present

## 2024-05-26 DIAGNOSIS — M9901 Segmental and somatic dysfunction of cervical region: Secondary | ICD-10-CM | POA: Diagnosis not present

## 2024-05-26 DIAGNOSIS — M9902 Segmental and somatic dysfunction of thoracic region: Secondary | ICD-10-CM | POA: Diagnosis not present

## 2024-05-30 DIAGNOSIS — F4323 Adjustment disorder with mixed anxiety and depressed mood: Secondary | ICD-10-CM | POA: Diagnosis not present

## 2024-05-30 DIAGNOSIS — Z630 Problems in relationship with spouse or partner: Secondary | ICD-10-CM | POA: Diagnosis not present

## 2024-06-02 DIAGNOSIS — M9902 Segmental and somatic dysfunction of thoracic region: Secondary | ICD-10-CM | POA: Diagnosis not present

## 2024-06-02 DIAGNOSIS — G44209 Tension-type headache, unspecified, not intractable: Secondary | ICD-10-CM | POA: Diagnosis not present

## 2024-06-02 DIAGNOSIS — M9901 Segmental and somatic dysfunction of cervical region: Secondary | ICD-10-CM | POA: Diagnosis not present

## 2024-06-02 DIAGNOSIS — M7912 Myalgia of auxiliary muscles, head and neck: Secondary | ICD-10-CM | POA: Diagnosis not present

## 2024-06-06 DIAGNOSIS — Z630 Problems in relationship with spouse or partner: Secondary | ICD-10-CM | POA: Diagnosis not present

## 2024-06-06 DIAGNOSIS — F4323 Adjustment disorder with mixed anxiety and depressed mood: Secondary | ICD-10-CM | POA: Diagnosis not present

## 2024-06-09 DIAGNOSIS — M9901 Segmental and somatic dysfunction of cervical region: Secondary | ICD-10-CM | POA: Diagnosis not present

## 2024-06-09 DIAGNOSIS — M7912 Myalgia of auxiliary muscles, head and neck: Secondary | ICD-10-CM | POA: Diagnosis not present

## 2024-06-09 DIAGNOSIS — M9902 Segmental and somatic dysfunction of thoracic region: Secondary | ICD-10-CM | POA: Diagnosis not present

## 2024-06-09 DIAGNOSIS — G44209 Tension-type headache, unspecified, not intractable: Secondary | ICD-10-CM | POA: Diagnosis not present

## 2024-06-13 DIAGNOSIS — F4323 Adjustment disorder with mixed anxiety and depressed mood: Secondary | ICD-10-CM | POA: Diagnosis not present

## 2024-06-13 DIAGNOSIS — Z630 Problems in relationship with spouse or partner: Secondary | ICD-10-CM | POA: Diagnosis not present

## 2024-06-20 DIAGNOSIS — Z630 Problems in relationship with spouse or partner: Secondary | ICD-10-CM | POA: Diagnosis not present

## 2024-06-20 DIAGNOSIS — F4323 Adjustment disorder with mixed anxiety and depressed mood: Secondary | ICD-10-CM | POA: Diagnosis not present

## 2024-06-23 DIAGNOSIS — M7912 Myalgia of auxiliary muscles, head and neck: Secondary | ICD-10-CM | POA: Diagnosis not present

## 2024-06-23 DIAGNOSIS — M9902 Segmental and somatic dysfunction of thoracic region: Secondary | ICD-10-CM | POA: Diagnosis not present

## 2024-06-23 DIAGNOSIS — M9901 Segmental and somatic dysfunction of cervical region: Secondary | ICD-10-CM | POA: Diagnosis not present

## 2024-06-23 DIAGNOSIS — G44209 Tension-type headache, unspecified, not intractable: Secondary | ICD-10-CM | POA: Diagnosis not present

## 2024-06-30 DIAGNOSIS — M9901 Segmental and somatic dysfunction of cervical region: Secondary | ICD-10-CM | POA: Diagnosis not present

## 2024-06-30 DIAGNOSIS — M7912 Myalgia of auxiliary muscles, head and neck: Secondary | ICD-10-CM | POA: Diagnosis not present

## 2024-06-30 DIAGNOSIS — M9902 Segmental and somatic dysfunction of thoracic region: Secondary | ICD-10-CM | POA: Diagnosis not present

## 2024-07-14 DIAGNOSIS — M9902 Segmental and somatic dysfunction of thoracic region: Secondary | ICD-10-CM | POA: Diagnosis not present

## 2024-07-14 DIAGNOSIS — M7912 Myalgia of auxiliary muscles, head and neck: Secondary | ICD-10-CM | POA: Diagnosis not present

## 2024-07-14 DIAGNOSIS — M9901 Segmental and somatic dysfunction of cervical region: Secondary | ICD-10-CM | POA: Diagnosis not present

## 2024-07-14 DIAGNOSIS — G44209 Tension-type headache, unspecified, not intractable: Secondary | ICD-10-CM | POA: Diagnosis not present

## 2024-07-15 DIAGNOSIS — F4323 Adjustment disorder with mixed anxiety and depressed mood: Secondary | ICD-10-CM | POA: Diagnosis not present

## 2024-07-15 DIAGNOSIS — Z630 Problems in relationship with spouse or partner: Secondary | ICD-10-CM | POA: Diagnosis not present

## 2024-07-22 DIAGNOSIS — Z630 Problems in relationship with spouse or partner: Secondary | ICD-10-CM | POA: Diagnosis not present

## 2024-07-22 DIAGNOSIS — F4323 Adjustment disorder with mixed anxiety and depressed mood: Secondary | ICD-10-CM | POA: Diagnosis not present

## 2024-07-30 DIAGNOSIS — Z630 Problems in relationship with spouse or partner: Secondary | ICD-10-CM | POA: Diagnosis not present

## 2024-07-30 DIAGNOSIS — F4323 Adjustment disorder with mixed anxiety and depressed mood: Secondary | ICD-10-CM | POA: Diagnosis not present

## 2024-08-05 DIAGNOSIS — N951 Menopausal and female climacteric states: Secondary | ICD-10-CM | POA: Diagnosis not present

## 2024-08-05 DIAGNOSIS — Z79890 Hormone replacement therapy (postmenopausal): Secondary | ICD-10-CM | POA: Diagnosis not present

## 2024-08-13 DIAGNOSIS — Z630 Problems in relationship with spouse or partner: Secondary | ICD-10-CM | POA: Diagnosis not present

## 2024-08-13 DIAGNOSIS — F4323 Adjustment disorder with mixed anxiety and depressed mood: Secondary | ICD-10-CM | POA: Diagnosis not present

## 2024-08-19 DIAGNOSIS — F4323 Adjustment disorder with mixed anxiety and depressed mood: Secondary | ICD-10-CM | POA: Diagnosis not present

## 2024-08-19 DIAGNOSIS — Z630 Problems in relationship with spouse or partner: Secondary | ICD-10-CM | POA: Diagnosis not present

## 2024-08-26 DIAGNOSIS — F4323 Adjustment disorder with mixed anxiety and depressed mood: Secondary | ICD-10-CM | POA: Diagnosis not present

## 2024-08-26 DIAGNOSIS — Z630 Problems in relationship with spouse or partner: Secondary | ICD-10-CM | POA: Diagnosis not present

## 2024-09-01 DIAGNOSIS — G44209 Tension-type headache, unspecified, not intractable: Secondary | ICD-10-CM | POA: Diagnosis not present

## 2024-09-01 DIAGNOSIS — M7912 Myalgia of auxiliary muscles, head and neck: Secondary | ICD-10-CM | POA: Diagnosis not present

## 2024-09-01 DIAGNOSIS — M9902 Segmental and somatic dysfunction of thoracic region: Secondary | ICD-10-CM | POA: Diagnosis not present

## 2024-09-01 DIAGNOSIS — M9901 Segmental and somatic dysfunction of cervical region: Secondary | ICD-10-CM | POA: Diagnosis not present

## 2024-09-02 DIAGNOSIS — Z630 Problems in relationship with spouse or partner: Secondary | ICD-10-CM | POA: Diagnosis not present

## 2024-09-02 DIAGNOSIS — F4323 Adjustment disorder with mixed anxiety and depressed mood: Secondary | ICD-10-CM | POA: Diagnosis not present

## 2024-09-16 DIAGNOSIS — Z630 Problems in relationship with spouse or partner: Secondary | ICD-10-CM | POA: Diagnosis not present

## 2024-09-16 DIAGNOSIS — F4323 Adjustment disorder with mixed anxiety and depressed mood: Secondary | ICD-10-CM | POA: Diagnosis not present
